# Patient Record
Sex: Male | Born: 1995 | Race: Black or African American | Hispanic: No | Marital: Single | Smoking: Never smoker
Health system: Southern US, Community
[De-identification: ages and names within clinical notes are randomized; demographics above are authoritative.]

## PROBLEM LIST (undated history)

## (undated) DIAGNOSIS — N029 Recurrent and persistent hematuria with unspecified morphologic changes: Secondary | ICD-10-CM

## (undated) DIAGNOSIS — M069 Rheumatoid arthritis, unspecified: Secondary | ICD-10-CM

## (undated) HISTORY — PX: BONE MARROW BIOPSY: SHX199

## (undated) HISTORY — PX: TONSILLECTOMY: SUR1361

## (undated) HISTORY — PX: KNEE SURGERY: SHX244

## (undated) HISTORY — PX: RENAL BIOPSY: SHX156

---
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
## 2015-07-17 NOTE — Discharge Instructions (Signed)
T & A INSTRUCTION SHEET - [facility] °[facility] ° °CREIGHTON VAUGHT, MD °PAUL H. JUENGEL, MD  °P. SCOTT BENNETT °CHAPMAN MCQUEEN, MD ° °[address] [place], [place] [address] TEL. [phone] °[address] [place] [address] [phone] ° °INFORMATION SHEET FOR A TONSILLECTOMY AND ADENDOIDECTOMY ° °About Your Tonsils and Adenoids ° The tonsils and adenoids are normal body tissues that are part of our immune system.  They normally help to protect us against diseases that may enter our mouth and nose.  However, sometimes the tonsils and/or adenoids become too large and obstruct our breathing, especially at night. °  ° If either of these things happen it helps to remove the tonsils and adenoids in order to become healthier. The operation to remove the tonsils and adenoids is called a tonsillectomy and adenoidectomy. ° °The Location of Your Tonsils and Adenoids ° The tonsils are located in the back of the throat on both side and sit in a cradle of muscles. The adenoids are located in the roof of the mouth, behind the nose, and closely associated with the opening of the Eustachian tube to the ear. ° °Surgery on Tonsils and Adenoids ° A tonsillectomy and adenoidectomy is a short operation which takes about thirty minutes.  This includes being put to sleep and being awakened.  Tonsillectomies and adenoidectomies are performed at [facility] and may require observation period in the recovery room prior to going home. ° °Following the Operation for a Tonsillectomy ° A cautery machine is used to control bleeding.  Bleeding from a tonsillectomy and adenoidectomy is minimal and postoperatively the risk of bleeding is approximately four percent, although this rarely life threatening. ° ° ° °After your tonsillectomy and adenoidectomy post-op care at home: ° °1. Our patients are able to go home the same day.  You may be given prescriptions for pain  medications and antibiotics, if indicated. °2. It is extremely important to remember that fluid intake is of utmost importance after a tonsillectomy.  The amount that you drink must be maintained in the postoperative period.  A good indication of whether a child is getting enough fluid is whether his/her urine output is constant.  As long as children are urinating or wetting their diaper every 6 - 8 hours this is usually enough fluid intake.   °3. Although rare, this is a risk of some bleeding in the first ten days after surgery.  This is usually occurs between day five and nine postoperatively.  This risk of bleeding is approximately four percent.  If you or your child should have any bleeding you should remain calm and notify our office or go directly to the Emergency Room at [facility] where they will contact us. Our doctors are available seven days a week for notification.  We recommend sitting up quietly in a chair, place an ice pack on the front of the neck and spitting out the blood gently until we are able to contact you.  Adults should gargle gently with ice water and this may help stop the bleeding.  If the bleeding does not stop after a short time, i.e. 10 to 15 minutes, or seems to be increasing again, please contact us or go to the hospital.   °4. It is common for the pain to be worse at 5 - 7 days postoperatively.  This occurs because the “scab” is peeling off and the mucous membrane (skin of   the throat) is growing back where the tonsils were.   °5. It is common for a low-grade fever, less than 102, during the first week after a tonsillectomy and adenoidectomy.  It is usually due to not drinking enough liquids, and we suggest your use liquid Tylenol or the pain medicine with Tylenol prescribed in order to keep your temperature below 102.  Please follow the directions on the back of the bottle. °6. Do not take aspirin or any products that contain aspirin such as Bufferin, Anacin,  Ecotrin, aspirin gum, Goodies, BC headache powders, etc., after a T&A because it can promote bleeding.  Please check with our office before administering any other medication that may been prescribed by other doctors during the two week post-operative period. °7. If you happen to look in the mirror or into your child’s mouth you will see white/gray patches on the back of the throat.  This is what a scab looks like in the mouth and is normal after having a T&A.  It will disappear once the tonsil area heals completely. However, it may cause a noticeable odor, and this too will disappear with time.     °8. You or your child may experience ear pain after having a T&A.  This is called referred pain and comes from the throat, but it is felt in the ears.  Ear pain is quite common and expected.  It will usually go away after ten days.  There is usually nothing wrong with the ears, and it is primarily due to the healing area stimulating the nerve to the ear that runs along the side of the throat.  Use either the prescribed pain medicine or Tylenol as needed.  °9. The throat tissues after a tonsillectomy are obviously sensitive.  Smoking around children who have had a tonsillectomy significantly increases the risk of bleeding.  DO NOT SMOKE!  ° °General Anesthesia, Adult, Care After °Refer to this sheet in the next few weeks. These instructions provide you with information on caring for yourself after your procedure. Your health care provider may also give you more specific instructions. Your treatment has been planned according to current medical practices, but problems sometimes occur. Call your health care provider if you have any problems or questions after your procedure. °WHAT TO EXPECT AFTER THE PROCEDURE °After the procedure, it is typical to experience: °· Sleepiness. °· Nausea and vomiting. °HOME CARE INSTRUCTIONS °· For the first 24 hours after general anesthesia: °¨ Have a responsible person with you. °¨ Do not  drive a car. If you are alone, do not take public transportation. °¨ Do not drink alcohol. °¨ Do not take medicine that has not been prescribed by your health care provider. °¨ Do not sign important papers or make important decisions. °¨ You may resume a normal diet and activities as directed by your health care provider. °· Change bandages (dressings) as directed. °· If you have questions or problems that seem related to general anesthesia, call the hospital and ask for the anesthetist or anesthesiologist on call. °SEEK MEDICAL CARE IF: °· You have nausea and vomiting that continue the day after anesthesia. °· You develop a rash. °SEEK IMMEDIATE MEDICAL CARE IF:  °· You have difficulty breathing. °· You have chest pain. °· You have any allergic problems. °  °This information is not intended to replace advice given to you by your health care provider. Make sure you discuss any questions you have with your health care provider. °  °Document   Released: 10/20/2000 Document Revised: 08/04/2014 Document Reviewed: 11/12/2011 °Elsevier Interactive Patient Education ©2016 [employer] ° °

## 2015-07-24 ENCOUNTER — Ambulatory Visit: Admission: RE | Admit: 2015-07-24 | Payer: 59 | Source: Ambulatory Visit | Admitting: Otolaryngology

## 2015-07-24 ENCOUNTER — Encounter: Admission: RE | Payer: Self-pay | Source: Ambulatory Visit

## 2015-07-24 SURGERY — TONSILLECTOMY
Anesthesia: General

## 2015-08-01 ENCOUNTER — Encounter: Payer: Self-pay | Admitting: Student in an Organized Health Care Education/Training Program

## 2015-08-01 ENCOUNTER — Encounter: Payer: Self-pay | Admitting: *Deleted

## 2015-08-03 NOTE — Discharge Instructions (Signed)
T & A INSTRUCTION SHEET - [facility] °[facility] ° °CREIGHTON VAUGHT, MD °PAUL H. JUENGEL, MD  °P. SCOTT BENNETT °CHAPMAN MCQUEEN, MD ° °[address] [place], [place] [address] TEL. [phone] °[address] [place] [address] [phone] ° °INFORMATION SHEET FOR A TONSILLECTOMY AND ADENDOIDECTOMY ° °About Your Tonsils and Adenoids ° The tonsils and adenoids are normal body tissues that are part of our immune system.  They normally help to protect us against diseases that may enter our mouth and nose.  However, sometimes the tonsils and/or adenoids become too large and obstruct our breathing, especially at night. °  ° If either of these things happen it helps to remove the tonsils and adenoids in order to become healthier. The operation to remove the tonsils and adenoids is called a tonsillectomy and adenoidectomy. ° °The Location of Your Tonsils and Adenoids ° The tonsils are located in the back of the throat on both side and sit in a cradle of muscles. The adenoids are located in the roof of the mouth, behind the nose, and closely associated with the opening of the Eustachian tube to the ear. ° °Surgery on Tonsils and Adenoids ° A tonsillectomy and adenoidectomy is a short operation which takes about thirty minutes.  This includes being put to sleep and being awakened.  Tonsillectomies and adenoidectomies are performed at [facility] and may require observation period in the recovery room prior to going home. ° °Following the Operation for a Tonsillectomy ° A cautery machine is used to control bleeding.  Bleeding from a tonsillectomy and adenoidectomy is minimal and postoperatively the risk of bleeding is approximately four percent, although this rarely life threatening. ° ° ° °After your tonsillectomy and adenoidectomy post-op care at home: ° °1. Our patients are able to go home the same day.  You may be given prescriptions for pain  medications and antibiotics, if indicated. °2. It is extremely important to remember that fluid intake is of utmost importance after a tonsillectomy.  The amount that you drink must be maintained in the postoperative period.  A good indication of whether a child is getting enough fluid is whether his/her urine output is constant.  As long as children are urinating or wetting their diaper every 6 - 8 hours this is usually enough fluid intake.   °3. Although rare, this is a risk of some bleeding in the first ten days after surgery.  This is usually occurs between day five and nine postoperatively.  This risk of bleeding is approximately four percent.  If you or your child should have any bleeding you should remain calm and notify our office or go directly to the Emergency Room at [facility] where they will contact us. Our doctors are available seven days a week for notification.  We recommend sitting up quietly in a chair, place an ice pack on the front of the neck and spitting out the blood gently until we are able to contact you.  Adults should gargle gently with ice water and this may help stop the bleeding.  If the bleeding does not stop after a short time, i.e. 10 to 15 minutes, or seems to be increasing again, please contact us or go to the hospital.   °4. It is common for the pain to be worse at 5 - 7 days postoperatively.  This occurs because the “scab” is peeling off and the mucous membrane (skin of   the throat) is growing back where the tonsils were.   °5. It is common for a low-grade fever, less than 102, during the first week after a tonsillectomy and adenoidectomy.  It is usually due to not drinking enough liquids, and we suggest your use liquid Tylenol or the pain medicine with Tylenol prescribed in order to keep your temperature below 102.  Please follow the directions on the back of the bottle. °6. Do not take aspirin or any products that contain aspirin such as Bufferin, Anacin,  Ecotrin, aspirin gum, Goodies, BC headache powders, etc., after a T&A because it can promote bleeding.  Please check with our office before administering any other medication that may been prescribed by other doctors during the two week post-operative period. °7. If you happen to look in the mirror or into your child’s mouth you will see white/gray patches on the back of the throat.  This is what a scab looks like in the mouth and is normal after having a T&A.  It will disappear once the tonsil area heals completely. However, it may cause a noticeable odor, and this too will disappear with time.     °8. You or your child may experience ear pain after having a T&A.  This is called referred pain and comes from the throat, but it is felt in the ears.  Ear pain is quite common and expected.  It will usually go away after ten days.  There is usually nothing wrong with the ears, and it is primarily due to the healing area stimulating the nerve to the ear that runs along the side of the throat.  Use either the prescribed pain medicine or Tylenol as needed.  °9. The throat tissues after a tonsillectomy are obviously sensitive.  Smoking around children who have had a tonsillectomy significantly increases the risk of bleeding.  DO NOT SMOKE!  ° °General Anesthesia, Adult, Care After °Refer to this sheet in the next few weeks. These instructions provide you with information on caring for yourself after your procedure. Your health care provider may also give you more specific instructions. Your treatment has been planned according to current medical practices, but problems sometimes occur. Call your health care provider if you have any problems or questions after your procedure. °WHAT TO EXPECT AFTER THE PROCEDURE °After the procedure, it is typical to experience: °· Sleepiness. °· Nausea and vomiting. °HOME CARE INSTRUCTIONS °· For the first 24 hours after general anesthesia: °¨ Have a responsible person with you. °¨ Do not  drive a car. If you are alone, do not take public transportation. °¨ Do not drink alcohol. °¨ Do not take medicine that has not been prescribed by your health care provider. °¨ Do not sign important papers or make important decisions. °¨ You may resume a normal diet and activities as directed by your health care provider. °· Change bandages (dressings) as directed. °· If you have questions or problems that seem related to general anesthesia, call the hospital and ask for the anesthetist or anesthesiologist on call. °SEEK MEDICAL CARE IF: °· You have nausea and vomiting that continue the day after anesthesia. °· You develop a rash. °SEEK IMMEDIATE MEDICAL CARE IF:  °· You have difficulty breathing. °· You have chest pain. °· You have any allergic problems. °  °This information is not intended to replace advice given to you by your health care provider. Make sure you discuss any questions you have with your health care provider. °  °Document   Released: 10/20/2000 Document Revised: 08/04/2014 Document Reviewed: 11/12/2011 °Elsevier Interactive Patient Education ©2016 Elsevier Inc. ° °

## 2015-08-07 ENCOUNTER — Ambulatory Visit: Admission: RE | Admit: 2015-08-07 | Payer: 59 | Source: Ambulatory Visit | Admitting: Otolaryngology

## 2015-08-07 HISTORY — DX: Rheumatoid arthritis, unspecified: M06.9

## 2015-08-07 HISTORY — DX: Recurrent and persistent hematuria with unspecified morphologic changes: N02.9

## 2015-08-07 SURGERY — TONSILLECTOMY
Anesthesia: General

## 2017-03-01 DIAGNOSIS — R070 Pain in throat: Secondary | ICD-10-CM | POA: Diagnosis present

## 2017-03-01 DIAGNOSIS — J029 Acute pharyngitis, unspecified: Secondary | ICD-10-CM | POA: Insufficient documentation

## 2017-03-01 LAB — POCT RAPID STREP A: STREPTOCOCCUS, GROUP A SCREEN (DIRECT): NEGATIVE

## 2017-03-01 NOTE — ED Triage Notes (Signed)
Patient c/o throat/tonsil pain. Pt has swelling to bilateral tonsils with bloody drainage from left tonsil.

## 2017-03-02 ENCOUNTER — Emergency Department
Admission: EM | Admit: 2017-03-02 | Discharge: 2017-03-02 | Disposition: A | Discharge status: Home / Self Care | Payer: 59 | Attending: Emergency Medicine | Admitting: Emergency Medicine

## 2017-03-02 DIAGNOSIS — J039 Acute tonsillitis, unspecified: Secondary | ICD-10-CM

## 2017-03-02 DIAGNOSIS — J029 Acute pharyngitis, unspecified: Secondary | ICD-10-CM

## 2017-03-02 MED ORDER — AMOXICILLIN 500 MG PO CAPS
1000.0000 mg | ORAL_CAPSULE | Freq: Once | ORAL | Status: AC
  Administered 2017-03-02: 1000 mg via ORAL
  Filled 2017-03-02: qty 2

## 2017-03-02 MED ORDER — AMOXICILLIN 875 MG PO TABS: 875.0000 mg | ORAL_TABLET | Freq: Two times a day (BID) | ORAL | 0 refills | Status: DC

## 2017-03-02 NOTE — ED Provider Notes (Signed)
[facility] Emergency Department Provider Note   ____________________________________________   First MD Initiated Contact with Patient 03/02/17 [phone]     (approximate)  I have reviewed the triage vital signs and the nursing notes.   HISTORY  Chief Complaint Sore Throat    HPI Terry Romero is a 21 y.o. male who comes into the hospital today with some throat pain. He reports that his tonsils are bleeding earlier but he is not sure which one. He reports that when he looks at them there are gross. The blood has clotted but now it looks that there is pus on them. The patient has not been taking anything for his pain. He reports that this pain is a 3 out of 10 in intensity. He reports that the pain started yesterday and then started getting worse. He's had no fevers no nausea or vomiting. The patient has had problems with his tonsils for over a year and was supposed to have a tonsillectomy. He reports that he's missed his appointment and he is unable to be seen at the [facility] and [place] anymore. He reports that he has not followed up with anyone since. The patient is here today for evaluation.He reports that sometimes when he sleeping he does feel short of breath but he doesn't have any other symptoms at this moment.   Past Medical History:  Diagnosis Date  . Rheumatoid arthritis (HCC)    no meds, no limitations  . Thin basement membrane disease     There are no active problems to display for this patient.   Past Surgical History:  Procedure Laterality Date  . BONE MARROW BIOPSY    . KNEE SURGERY Right   . RENAL BIOPSY      Prior to Admission medications   Medication Sig Start Date End Date Taking? Authorizing Provider  amoxicillin (AMOXIL) 875 MG tablet Take 1 tablet (875 mg total) by mouth 2 (two) times daily. 03/02/17   Loney Hering, MD    Allergies Nsaids  No family history on file.  Social History Social History   Substance Use Topics  . Smoking status: Never Smoker  . Smokeless tobacco: Never Used  . Alcohol use No    Review of Systems  Constitutional: No fever/chills Eyes: No visual changes. ENT:  sore throat, pus on tonsils Cardiovascular: Denies chest pain. Respiratory: Denies shortness of breath. Gastrointestinal: No abdominal pain.  No nausea, no vomiting.  No diarrhea.  No constipation. Genitourinary: Negative for dysuria. Musculoskeletal: Negative for back pain. Skin: Negative for rash. Neurological: Negative for headaches, focal weakness or numbness.   ____________________________________________   PHYSICAL EXAM:  VITAL SIGNS: ED Triage Vitals  Enc Vitals Group     BP 03/01/17 2301 (!) 149/98     Pulse Rate 03/01/17 2301 77     Resp 03/01/17 2301 15     Temp 03/01/17 2301 98.7 F (37.1 C)     Temp Source 03/01/17 2301 Oral     SpO2 03/01/17 2301 100 %     Weight 03/01/17 2302 143 lb (64.9 kg)     Height 03/01/17 2302 6' (1.829 m)     Head Circumference --      Peak Flow --      Pain Score 03/01/17 2301 3     Pain Loc --      Pain Edu? --      Excl. in [place]? --     Constitutional: Alert and oriented. Well  appearing and in Mild distress. Eyes: Conjunctivae are normal. PERRL. EOMI. Head: Atraumatic. Nose: No congestion/rhinnorhea. Mouth/Throat: Mucous membranes are moist.  Oropharynx non-erythematous. Patient with some large tonsils that shows some exudate bilaterally. No active bleeding at this time. No peri tonsillar abscess or significant erythema. Hematological/Lymphatic/Immunilogical: cervical lymphadenopathy. Cardiovascular: Normal rate, regular rhythm. Grossly normal heart sounds.  Good peripheral circulation. Respiratory: Normal respiratory effort.  No retractions. Lungs CTAB. Gastrointestinal: Soft and nontender. No distention. Positive bowel sounds Musculoskeletal: No lower extremity tenderness nor edema.   Neurologic:  Normal speech and language.  Skin:   Skin is warm, dry and intact. [facility] Psychiatric: Mood and affect are normal.   ____________________________________________   LABS (all labs ordered are listed, but only abnormal results are displayed)  Labs Reviewed  POCT RAPID STREP A   ____________________________________________  EKG  none ____________________________________________  RADIOLOGY  No results found.  ____________________________________________   PROCEDURES  Procedure(s) performed: None  Procedures  Critical Care performed: No  ____________________________________________   INITIAL IMPRESSION / ASSESSMENT AND PLAN / ED COURSE  Pertinent labs & imaging results that were available during my care of the patient were reviewed by me and considered in my medical decision making (see chart for details).  This is a 21 year old male who comes into the hospital today with some sore throat and tonsillar bleeding. The patient also noticed some pus on his tonsils. The patient doesn't have any bleeding but he does have some tonsillar exudates. The patient's strep was negative but I will still treat the patient with some amoxicillin. I encouraged him to follow-up with his tonsils are very large and they need to be removed. The patient reports that he will follow-up. The patient will be discharged home to follow-up with ENT.      ____________________________________________   FINAL CLINICAL IMPRESSION(S) / ED DIAGNOSES  Final diagnoses:  Pharyngitis, unspecified etiology  Tonsillitis      NEW MEDICATIONS STARTED DURING THIS VISIT:  Discharge Medication List as of 03/02/2017  3:03 AM    START taking these medications   Details  amoxicillin (AMOXIL) 875 MG tablet Take 1 tablet (875 mg total) by mouth 2 (two) times daily., Starting Mon 03/02/2017, Print         Note:  This document was prepared using Dragon voice recognition software and may include unintentional dictation errors.    Loney Hering,  MD 03/02/17 [phone]

## 2017-03-02 NOTE — Discharge Instructions (Signed)
Please follow up with ENT

## 2018-12-15 ENCOUNTER — Emergency Department
Admission: EM | Admit: 2018-12-15 | Discharge: 2018-12-15 | Disposition: A | Discharge status: Home / Self Care | Payer: 59 | Attending: Emergency Medicine | Admitting: Emergency Medicine

## 2018-12-15 ENCOUNTER — Other Ambulatory Visit: Payer: Self-pay

## 2018-12-15 ENCOUNTER — Encounter: Payer: Self-pay | Admitting: Emergency Medicine

## 2018-12-15 ENCOUNTER — Emergency Department: Payer: 59

## 2018-12-15 DIAGNOSIS — R1032 Left lower quadrant pain: Secondary | ICD-10-CM | POA: Diagnosis present

## 2018-12-15 DIAGNOSIS — K5792 Diverticulitis of intestine, part unspecified, without perforation or abscess without bleeding: Secondary | ICD-10-CM

## 2018-12-15 DIAGNOSIS — K5732 Diverticulitis of large intestine without perforation or abscess without bleeding: Secondary | ICD-10-CM | POA: Diagnosis not present

## 2018-12-15 LAB — COMPREHENSIVE METABOLIC PANEL
ALT: 17 U/L (ref 0–44)
AST: 17 U/L (ref 15–41)
Albumin: 4.1 g/dL (ref 3.5–5.0)
Alkaline Phosphatase: 56 U/L (ref 38–126)
Anion gap: 8 (ref 5–15)
BUN: 13 mg/dL (ref 6–20)
CO2: 25 mmol/L (ref 22–32)
Calcium: 9.1 mg/dL (ref 8.9–10.3)
Chloride: 99 mmol/L (ref 98–111)
Creatinine, Ser: 0.91 mg/dL (ref 0.61–1.24)
GFR calc Af Amer: >60 mL/min (ref >60)
GFR calc non Af Amer: >60 mL/min (ref >60)
Glucose, Bld: 153 mg/dL — ABNORMAL HIGH (ref 70–99)
Potassium: 3.8 mmol/L (ref 3.5–5.1)
Sodium: 132 mmol/L — ABNORMAL LOW (ref 135–145)
Total Bilirubin: 1.5 mg/dL — ABNORMAL HIGH (ref 0.3–1.2)
Total Protein: 7.7 g/dL (ref 6.5–8.1)

## 2018-12-15 LAB — CBC
HCT: 38.3 % — ABNORMAL LOW (ref 39.0–52.0)
Hemoglobin: 13.4 g/dL (ref 13.0–17.0)
MCH: 27 pg (ref 26.0–34.0)
MCHC: 35 g/dL (ref 30.0–36.0)
MCV: 77.1 fL — ABNORMAL LOW (ref 80.0–100.0)
Platelets: 199 10*3/uL (ref 150–400)
RBC: 4.97 MIL/uL (ref 4.22–5.81)
RDW: 13.3 % (ref 11.5–15.5)
WBC: 14.8 10*3/uL — ABNORMAL HIGH (ref 4.0–10.5)
nRBC: 0 % (ref 0.0–0.2)

## 2018-12-15 LAB — URINALYSIS, COMPLETE (UACMP) WITH MICROSCOPIC
Bacteria, UA: NONE SEEN
Bilirubin Urine: NEGATIVE
Glucose, UA: NEGATIVE mg/dL
Hgb urine dipstick: NEGATIVE
Ketones, ur: 5 mg/dL — AB
Leukocytes,Ua: NEGATIVE
Nitrite: NEGATIVE
Protein, ur: NEGATIVE mg/dL
Specific Gravity, Urine: 1.029 (ref 1.005–1.030)
Squamous Epithelial / HPF: NONE SEEN (ref 0–5)
pH: 6 (ref 5.0–8.0)

## 2018-12-15 LAB — LIPASE, BLOOD: Lipase: 26 U/L (ref 11–51)

## 2018-12-15 MED ORDER — OXYCODONE-ACETAMINOPHEN 5-325 MG PO TABS: 1.0000 | ORAL_TABLET | Freq: Three times a day (TID) | ORAL | 0 refills | Status: DC | PRN

## 2018-12-15 MED ORDER — IOHEXOL 300 MG/ML  SOLN
100.0000 mL | Freq: Once | INTRAMUSCULAR | Status: AC | PRN
  Administered 2018-12-15: 100 mL via INTRAVENOUS

## 2018-12-15 MED ORDER — SODIUM CHLORIDE 0.9% FLUSH: 3.0000 mL | Freq: Once | INTRAVENOUS | Status: DC

## 2018-12-15 MED ORDER — AMOXICILLIN-POT CLAVULANATE ER 1000-62.5 MG PO TB12: 1.0000 | ORAL_TABLET | Freq: Two times a day (BID) | ORAL | 0 refills | Status: AC

## 2018-12-15 NOTE — ED Triage Notes (Signed)
Pt here with c/o abd pain LLQ that began 2 days ago, denies N,V,D. NAD.

## 2018-12-15 NOTE — ED Provider Notes (Signed)
[name]       Time seen: ----------------------------------------- 11:11 AM on 12/15/2018 -----------------------------------------   I have reviewed the triage vital signs and the nursing notes.  HISTORY   Chief Complaint Abdominal Pain    HPI Story Terry Romero is a 23 y.o. male with a history of rheumatoid arthritis who presents to the ED for left lower quadrant pain that began 2 days ago.  Patient denies nausea, vomiting or diarrhea.  He also denies fevers or chills.  Pain is 9 out of 10 in the left lower abdomen.  Nothing makes it better or worse.  Past Medical History:  Diagnosis Date  . Rheumatoid arthritis (HCC)    no meds, no limitations  . Thin basement membrane disease     There are no active problems to display for this patient.   Past Surgical History:  Procedure Laterality Date  . BONE MARROW BIOPSY    . KNEE SURGERY Right   . RENAL BIOPSY      Allergies Meperidine and related and Nsaids  Social History Social History   Tobacco Use  . Smoking status: Never Smoker  . Smokeless tobacco: Never Used  Substance Use Topics  . Alcohol use: No  . Drug use: Not on file   Review of Systems Constitutional: Negative for fever. Cardiovascular: Negative for chest pain. Respiratory: Negative for shortness of breath. Gastrointestinal: Positive for abdominal pain Musculoskeletal: Negative for back pain. Skin: Negative for rash. Neurological: Negative for headaches, focal weakness or numbness.  All systems negative/normal/unremarkable except as stated in the HPI  ____________________________________________   PHYSICAL EXAM:  VITAL SIGNS: ED Triage Vitals  Enc Vitals Group     BP 12/15/18 0915 140/79     Pulse Rate 12/15/18 0915 (!) 101     Resp 12/15/18 0915 16     Temp 12/15/18 0915 98.6 F (37 C)     Temp Source 12/15/18 0915 Oral     SpO2 12/15/18 0915 100 %     Weight 12/15/18 0914 150  lb (68 kg)     Height 12/15/18 0914 6' (1.829 m)     Head Circumference --      Peak Flow --      Pain Score 12/15/18 0914 9     Pain Loc --      Pain Edu? --      Excl. in [place]? --    Constitutional: Alert and oriented. Well appearing and in no distress. Eyes: Conjunctivae are normal. Normal extraocular movements. ENT      Head: Normocephalic and atraumatic.      Nose: No congestion/rhinnorhea.      Mouth/Throat: Mucous membranes are moist.      Neck: No stridor. Cardiovascular: Normal rate, regular rhythm. No murmurs, rubs, or gallops. Respiratory: Normal respiratory effort without tachypnea nor retractions. Breath sounds are clear and equal bilaterally. No wheezes/rales/rhonchi. Gastrointestinal: Mild left lower quadrant tenderness, no rebound or guarding.  Normal bowel sounds. Musculoskeletal: Nontender with normal range of motion in extremities. No lower extremity tenderness nor edema. Neurologic:  Normal speech and language. No gross focal neurologic deficits are appreciated.  Skin:  Skin is warm, dry and intact. No rash noted. Psychiatric: Mood and affect are normal. Speech and behavior are normal.  ___________________________________________  ED COURSE:  As part of my medical decision making, I reviewed the following data within the [facility] History obtained from family if available, nursing notes, old chart and ekg, as  well as notes from prior ED visits. Patient presented for abdominal pain, we will assess with labs and imaging as indicated at this time.   Procedures  Hever Castilleja was evaluated in Emergency Department on 12/15/2018 for the symptoms described in the history of present illness. He was evaluated in the context of the global COVID-19 pandemic, which necessitated consideration that the patient might be at risk for infection with the SARS-CoV-2 virus that causes COVID-19. Institutional protocols and algorithms that pertain to the evaluation of  patients at risk for COVID-19 are in a state of rapid change based on information released by regulatory bodies including the CDC and federal and state organizations. These policies and algorithms were followed during the patient's care in the ED.  ____________________________________________   LABS (pertinent positives/negatives)  Labs Reviewed  COMPREHENSIVE METABOLIC PANEL - Abnormal; Notable for the following components:      Result Value   Sodium 132 (*)    Glucose, Bld 153 (*)    Total Bilirubin 1.5 (*)    All other components within normal limits  CBC - Abnormal; Notable for the following components:   WBC 14.8 (*)    HCT 38.3 (*)    MCV 77.1 (*)    All other components within normal limits  URINALYSIS, COMPLETE (UACMP) WITH MICROSCOPIC - Abnormal; Notable for the following components:   Color, Urine YELLOW (*)    APPearance CLEAR (*)    Ketones, ur 5 (*)    All other components within normal limits  LIPASE, BLOOD    RADIOLOGY Images were viewed by me  IMPRESSION: 1. Distal descending colonic diverticulitis. There is wall thickening in this area with mesenteric thickening and fluid tracking into the lateral conal fascia. No perforation or abscess in this area. Occasional sigmoid diverticula noted without inflammation in these areas.  2. No evident bowel obstruction. No abscess in the abdomen or pelvis. No periappendiceal region inflammation. Small amount of fluid in the dependent portion of the pelvis, likely of reactive etiology given the distal descending colonic diverticulitis.  3. No evident renal or ureteral calculus. No hydronephrosis on either side. Urinary bladder wall thickness within normal limits.  ____________________________________________   DIFFERENTIAL DIAGNOSIS   Muscle strain, renal colic, UTI, pyelonephritis, diverticulitis unlikely  FINAL ASSESSMENT AND PLAN  Diverticulitis   Plan: The patient had presented for left lower quadrant  pain. Patient's labs feel leukocytosis with no other acute abnormality. Patient's imaging surprisingly revealed diverticulitis.  He will be placed on antibiotics with pain medicine and will be referred to GI for close outpatient follow-up.   Laurence Aly, MD    Note: This note was generated in part or whole with voice recognition software. Voice recognition is usually quite accurate but there are transcription errors that can and very often do occur. I apologize for any typographical errors that were not detected and corrected.     Earleen Newport, MD 12/15/18 [phone]

## 2018-12-15 NOTE — ED Notes (Signed)
Patient transported to CT 

## 2020-12-28 ENCOUNTER — Other Ambulatory Visit: Payer: Self-pay

## 2020-12-28 ENCOUNTER — Emergency Department
Admission: EM | Admit: 2020-12-28 | Discharge: 2020-12-28 | Disposition: A | Discharge status: Home / Self Care | Payer: 59 | Attending: Emergency Medicine | Admitting: Emergency Medicine

## 2020-12-28 ENCOUNTER — Emergency Department: Payer: 59

## 2020-12-28 DIAGNOSIS — K5732 Diverticulitis of large intestine without perforation or abscess without bleeding: Secondary | ICD-10-CM | POA: Diagnosis not present

## 2020-12-28 DIAGNOSIS — K5792 Diverticulitis of intestine, part unspecified, without perforation or abscess without bleeding: Secondary | ICD-10-CM

## 2020-12-28 DIAGNOSIS — R1032 Left lower quadrant pain: Secondary | ICD-10-CM | POA: Diagnosis present

## 2020-12-28 LAB — COMPREHENSIVE METABOLIC PANEL
ALT: 17 U/L (ref 0–44)
AST: 19 U/L (ref 15–41)
Albumin: 4.2 g/dL (ref 3.5–5.0)
Alkaline Phosphatase: 50 U/L (ref 38–126)
Anion gap: 8 (ref 5–15)
BUN: 12 mg/dL (ref 6–20)
CO2: 25 mmol/L (ref 22–32)
Calcium: 8.9 mg/dL (ref 8.9–10.3)
Chloride: 104 mmol/L (ref 98–111)
Creatinine, Ser: 0.92 mg/dL (ref 0.61–1.24)
GFR, Estimated: >60 mL/min (ref >60)
Glucose, Bld: 111 mg/dL — ABNORMAL HIGH (ref 70–99)
Potassium: 4.2 mmol/L (ref 3.5–5.1)
Sodium: 137 mmol/L (ref 135–145)
Total Bilirubin: 0.7 mg/dL (ref 0.3–1.2)
Total Protein: 7.3 g/dL (ref 6.5–8.1)

## 2020-12-28 LAB — URINALYSIS, COMPLETE (UACMP) WITH MICROSCOPIC
Bacteria, UA: NONE SEEN
Bilirubin Urine: NEGATIVE
Glucose, UA: NEGATIVE mg/dL
Hgb urine dipstick: NEGATIVE
Ketones, ur: NEGATIVE mg/dL
Leukocytes,Ua: NEGATIVE
Nitrite: NEGATIVE
Protein, ur: NEGATIVE mg/dL
Specific Gravity, Urine: 1.029 (ref 1.005–1.030)
Squamous Epithelial / HPF: NONE SEEN (ref 0–5)
pH: 6 (ref 5.0–8.0)

## 2020-12-28 LAB — CBC
HCT: 38.6 % — ABNORMAL LOW (ref 39.0–52.0)
Hemoglobin: 13.5 g/dL (ref 13.0–17.0)
MCH: 27.4 pg (ref 26.0–34.0)
MCHC: 35 g/dL (ref 30.0–36.0)
MCV: 78.3 fL — ABNORMAL LOW (ref 80.0–100.0)
Platelets: 212 10*3/uL (ref 150–400)
RBC: 4.93 MIL/uL (ref 4.22–5.81)
RDW: 13.5 % (ref 11.5–15.5)
WBC: 13.4 10*3/uL — ABNORMAL HIGH (ref 4.0–10.5)
nRBC: 0 % (ref 0.0–0.2)

## 2020-12-28 LAB — LIPASE, BLOOD: Lipase: 29 U/L (ref 11–51)

## 2020-12-28 MED ORDER — MORPHINE SULFATE (PF) 4 MG/ML IV SOLN
4.0000 mg | Freq: Once | INTRAVENOUS | Status: AC
  Administered 2020-12-28: 4 mg via INTRAVENOUS
  Filled 2020-12-28: qty 1

## 2020-12-28 MED ORDER — AMOXICILLIN-POT CLAVULANATE 875-125 MG PO TABS: 1.0000 | ORAL_TABLET | Freq: Three times a day (TID) | ORAL | 0 refills | Status: AC

## 2020-12-28 MED ORDER — ONDANSETRON HCL 4 MG/2ML IJ SOLN
4.0000 mg | Freq: Once | INTRAMUSCULAR | Status: AC
  Administered 2020-12-28: 4 mg via INTRAVENOUS
  Filled 2020-12-28: qty 2

## 2020-12-28 MED ORDER — SODIUM CHLORIDE 0.9 % IV BOLUS
1000.0000 mL | Freq: Once | INTRAVENOUS | Status: AC
  Administered 2020-12-28: 1000 mL via INTRAVENOUS

## 2020-12-28 MED ORDER — OXYCODONE-ACETAMINOPHEN 5-325 MG PO TABS: 1.0000 | ORAL_TABLET | ORAL | 0 refills | Status: AC | PRN

## 2020-12-28 NOTE — ED Triage Notes (Signed)
First Nurse Note:  Sent from [facility] for ED evaluation of LLQ abdominal pain.  Patient states he has history of Diverticulosis.  AAOx3.  Skin warm and dry. NAD

## 2020-12-28 NOTE — ED Provider Notes (Signed)
[facility] Emergency Department Provider Note  ____________________________________________   Event Date/Time   First MD Initiated Contact with Patient 12/28/20 1554     (approximate)  I have reviewed the triage vital signs and the nursing notes.   HISTORY  Chief Complaint Abdominal Pain    HPI Terry Romero is a 25 y.o. male history of rheumatoid arthritis and diverticulitis presents emergency department with left lower quadrant abdominal pain.  [facility] and they told him he needed to come here for imaging.  States he did not realize how painful the left lower quadrant was until they pushed on it.  He denies any fever or chills.  No chest pain shortness of breath.  No diarrhea.  No dysuria.    Past Medical History:  Diagnosis Date  . Rheumatoid arthritis (HCC)    no meds, no limitations  . Thin basement membrane disease     There are no problems to display for this patient.   Past Surgical History:  Procedure Laterality Date  . BONE MARROW BIOPSY    . KNEE SURGERY Right   . RENAL BIOPSY      Prior to Admission medications   Medication Sig Start Date End Date Taking? Authorizing Provider  amoxicillin-clavulanate (AUGMENTIN) 875-125 MG tablet Take 1 tablet by mouth 3 (three) times daily for 7 days. 12/28/20 01/04/21 Yes Dayzha Pogosyan, Roselyn Bering, PA-C  oxyCODONE-acetaminophen (PERCOCET) 5-325 MG tablet Take 1 tablet by mouth every 4 (four) hours as needed for severe pain. 12/28/20 12/28/21 Yes Viral Schramm, Roselyn Bering, PA-C    Allergies Meperidine and related and Nsaids  No family history on file.  Social History Social History   Tobacco Use  . Smoking status: Never Smoker  . Smokeless tobacco: Never Used  Substance Use Topics  . Alcohol use: No    Review of Systems  Constitutional: No fever/chills Eyes: No visual changes. ENT: No sore throat. Respiratory: Denies cough Cardiovascular: Denies chest  pain Gastrointestinal: Positive for llq abdominal pain Genitourinary: Negative for dysuria. Musculoskeletal: Negative for back pain. Skin: Negative for rash. Psychiatric: no mood changes,     ____________________________________________   PHYSICAL EXAM:  VITAL SIGNS: ED Triage Vitals [12/28/20 1419]  Enc Vitals Group     BP 130/77     Pulse Rate 85     Resp 18     Temp 98.9 F (37.2 C)     Temp Source Oral     SpO2 99 %     Weight 164 lb (74.4 kg)     Height 6' (1.829 m)     Head Circumference      Peak Flow      Pain Score      Pain Loc      Pain Edu?      Excl. in [place]?     Constitutional: Alert and oriented. Well appearing and in no acute distress. Eyes: Conjunctivae are normal.  Head: Atraumatic. Nose: No congestion/rhinnorhea. Mouth/Throat: Mucous membranes are moist.   Neck:  supple no lymphadenopathy noted Cardiovascular: Normal rate, regular rhythm. Heart sounds are normal Respiratory: Normal respiratory effort.  No retractions, lungs c t a  Abd: soft tender in the left lower quadrant, bs normal all 4 quad GU: deferred Musculoskeletal: FROM all extremities, warm and well perfused Neurologic:  Normal speech and language.  Skin:  Skin is warm, dry and intact. No rash noted. Psychiatric: Mood and affect are normal. Speech and behavior are normal.  ____________________________________________   LABS (all labs ordered are listed, but only abnormal results are displayed)  Labs Reviewed  COMPREHENSIVE METABOLIC PANEL - Abnormal; Notable for the following components:      Result Value   Glucose, Bld 111 (*)    All other components within normal limits  CBC - Abnormal; Notable for the following components:   WBC 13.4 (*)    HCT 38.6 (*)    MCV 78.3 (*)    All other components within normal limits  URINALYSIS, COMPLETE (UACMP) WITH MICROSCOPIC - Abnormal; Notable for the following components:   Color, Urine YELLOW (*)    APPearance CLEAR (*)    All other  components within normal limits  LIPASE, BLOOD   ____________________________________________   ____________________________________________  RADIOLOGY  CT abdomen/pelvis without contrast secondary to contrast shortage  ____________________________________________   PROCEDURES  Procedure(s) performed: No  Procedures    ____________________________________________   INITIAL IMPRESSION / ASSESSMENT AND PLAN / ED COURSE  Pertinent labs & imaging results that were available during my care of the patient were reviewed by me and considered in my medical decision making (see chart for details).   Patient is a 25 year old male presents with left lower quadrant pain.  See HPI.  Physical exam shows patient appears stable  DDx: Kidney stone, diverticulosis, diverticulitis, abscess  CBC is elevated at 13.4, comprehensive metabolic panel is normal, lipase normal, urinalysis is normal  CT abdomen/pelvis without contrast secondary to contrast shortage   CT was reviewed by me confirmed by radiology.  Shows acute diverticulitis.  I did explain findings to the patient.  He is placed on Augmentin 3 times daily.  Percocet for pain.  Given a work note.  Instructed to return if worsening.  Follow-up with his GI doctor at [facility].  He was discharged stable condition.  Terry Romero was evaluated in Emergency Department on 12/28/2020 for the symptoms described in the history of present illness. He was evaluated in the context of the global COVID-19 pandemic, which necessitated consideration that the patient might be at risk for infection with the SARS-CoV-2 virus that causes COVID-19. Institutional protocols and algorithms that pertain to the evaluation of patients at risk for COVID-19 are in a state of rapid change based on information released by regulatory bodies including the CDC and federal and state organizations. These policies and algorithms were followed during the patient's care in the ED.     As part of my medical decision making, I reviewed the following data within the [place] notes reviewed and incorporated, Labs reviewed , Old chart reviewed, Radiograph reviewed , Notes from prior ED visits and [place] Controlled Substance Database  ____________________________________________   FINAL CLINICAL IMPRESSION(S) / ED DIAGNOSES  Final diagnoses:  Acute diverticulitis      NEW MEDICATIONS STARTED DURING THIS VISIT:  New Prescriptions   AMOXICILLIN-CLAVULANATE (AUGMENTIN) 875-125 MG TABLET    Take 1 tablet by mouth 3 (three) times daily for 7 days.   OXYCODONE-ACETAMINOPHEN (PERCOCET) 5-325 MG TABLET    Take 1 tablet by mouth every 4 (four) hours as needed for severe pain.     Note:  This document was prepared using Dragon voice recognition software and may include unintentional dictation errors.    Versie Starks, PA-C 12/28/20 1644    Nance Pear, MD 12/28/20 [phone]

## 2020-12-28 NOTE — ED Triage Notes (Signed)
Pt here from UC with LLQ abd pain. Pt has hx of diverticulosis. Pt is here for imaging.

## 2020-12-28 NOTE — Discharge Instructions (Signed)
Follow-up with your GI doctor at [facility] if not improving in 2 to 3 days.  Return emergency department worsening.  Take medications as prescribed.

## 2021-11-04 ENCOUNTER — Emergency Department: Payer: Managed Care, Other (non HMO)

## 2021-11-04 ENCOUNTER — Emergency Department
Admission: EM | Admit: 2021-11-04 | Discharge: 2021-11-04 | Disposition: A | Discharge status: Home / Self Care | Payer: Managed Care, Other (non HMO) | Attending: Emergency Medicine | Admitting: Emergency Medicine

## 2021-11-04 ENCOUNTER — Other Ambulatory Visit: Payer: Self-pay

## 2021-11-04 DIAGNOSIS — R079 Chest pain, unspecified: Secondary | ICD-10-CM | POA: Insufficient documentation

## 2021-11-04 LAB — BASIC METABOLIC PANEL
Anion gap: 9 (ref 5–15)
BUN: 10 mg/dL (ref 6–20)
CO2: 29 mmol/L (ref 22–32)
Calcium: 9.3 mg/dL (ref 8.9–10.3)
Chloride: 101 mmol/L (ref 98–111)
Creatinine, Ser: 0.99 mg/dL (ref 0.61–1.24)
GFR, Estimated: >60 mL/min (ref >60)
Glucose, Bld: 86 mg/dL (ref 70–99)
Potassium: 3.3 mmol/L — ABNORMAL LOW (ref 3.5–5.1)
Sodium: 139 mmol/L (ref 135–145)

## 2021-11-04 LAB — CBC
HCT: 42.8 % (ref 39.0–52.0)
Hemoglobin: 14.5 g/dL (ref 13.0–17.0)
MCH: 26.3 pg (ref 26.0–34.0)
MCHC: 33.9 g/dL (ref 30.0–36.0)
MCV: 77.5 fL — ABNORMAL LOW (ref 80.0–100.0)
Platelets: 230 10*3/uL (ref 150–400)
RBC: 5.52 MIL/uL (ref 4.22–5.81)
RDW: 12.9 % (ref 11.5–15.5)
WBC: 7.3 10*3/uL (ref 4.0–10.5)
nRBC: 0 % (ref 0.0–0.2)

## 2021-11-04 LAB — TROPONIN I (HIGH SENSITIVITY): Troponin I (High Sensitivity): 2 ng/L (ref <18)

## 2021-11-04 NOTE — Discharge Instructions (Signed)
Your blood work EKG and chest x-ray were all reassuring.  It is unlikely that this is related to your heart however if your pain is worsening or you develop new symptoms that are concerning to you such as shortness of breath, please return to the emergency department.  Otherwise please follow-up with primary care. ?

## 2021-11-04 NOTE — ED Provider Notes (Signed)
? ?[facility] ?Provider Note ? ? ? Event Date/Time  ? First MD Initiated Contact with Patient 11/04/21 2057   ?  (approximate) ? ? ?History  ? ?Chest Pain ? ? ?HPI ? ?Terry Romero is a 26 y.o. male thin membrane basement disease, RA, who presents with CP.  Symptoms have been going on for the last 3 days.  He endorses pressure- like sensation left side of his chest that is intermittent.  It is nonpleuritic nonexertional.  Does feel somewhat worse when he is moving around.  It comes and goes.  Not associated with shortness of breath nausea vomiting or diaphoresis.  Has no personal or family history of cardiac disease.The patient denies hx of prior DVT/PE, unilateral leg pain/swelling, hormone use, recent surgery, hx of cancer, prolonged immobilization, or hemoptysis.   ? ?  ? ?Past Medical History:  ?Diagnosis Date  ? Rheumatoid arthritis (HCC)   ? no meds, no limitations  ? Thin basement membrane disease   ? ? ?There are no problems to display for this patient. ? ? ? ?Physical Exam  ?Triage Vital Signs: ?ED Triage Vitals  ?Enc Vitals Group  ?   BP 11/04/21 1928 (!) 137/91  ?   Pulse Rate 11/04/21 1928 90  ?   Resp 11/04/21 1928 16  ?   Temp 11/04/21 1928 98.7 ?F (37.1 ?C)  ?   Temp Source 11/04/21 1928 Oral  ?   SpO2 11/04/21 1928 100 %  ?   Weight 11/04/21 1925 160 lb (72.6 kg)  ?   Height 11/04/21 1925 6' (1.829 m)  ?   Head Circumference --   ?   Peak Flow --   ?   Pain Score 11/04/21 1924 0  ?   Pain Loc --   ?   Pain Edu? --   ?   Excl. in [place]? --   ? ? ?Most recent vital signs: ?Vitals:  ? 11/04/21 1928 11/04/21 2152  ?BP: (!) 137/91 131/81  ?Pulse: 90 87  ?Resp: 16 18  ?Temp: 98.7 ?F (37.1 ?C)   ?SpO2: 100% 100%  ? ? ? ?General: Awake, no distress.  ?CV:  Good peripheral perfusion. No edema ?Resp:  Normal effort. Clear lungs ?Abd:  No distention.  ?Neuro:             Awake, Alert, Oriented x 3  ?Other:   ? ? ?ED Results / Procedures / Treatments  ?Labs ?(all labs ordered are listed, but  only abnormal results are displayed) ?Labs Reviewed  ?BASIC METABOLIC PANEL - Abnormal; Notable for the following components:  ?    Result Value  ? Potassium 3.3 (*)   ? All other components within normal limits  ?CBC - Abnormal; Notable for the following components:  ? MCV 77.5 (*)   ? All other components within normal limits  ?TROPONIN I (HIGH SENSITIVITY)  ?TROPONIN I (HIGH SENSITIVITY)  ? ? ? ?EKG ? ?EKG interpretation performed by myself: NSR, right axis, nml intervals, slight sagging of ST segments in inferior leads, no acute ischemic changes ? ? ? ?RADIOLOGY ?I reviewed the CXR which does not show any acute cardiopulmonary process; agree with radiology report  ? ? ? ?PROCEDURES: ? ?Critical Care performed: No ? ?Procedures ? ? ?MEDICATIONS ORDERED IN ED: ?Medications - No data to display ? ? ?IMPRESSION / MDM / ASSESSMENT AND PLAN / ED COURSE  ?I reviewed the triage vital signs and the nursing notes. ?             ?               ? ?  Differential diagnosis includes, but is not limited to, pleurisy, musculoskeletal, GERD, pericarditis, less likely myocarditis ACS pulmonary embolism ? ?Patient is a 26 year old male who presents with chest pain.  His left-sided nonexertional without associated symptoms.  Coming and going.  He has a history of rheumatoid arthritis and thin basement membrane kidney disease but no risk factors for early coronary disease.  Vital signs within normal limits he is not tachycardic or hypoxic.  Appears quite comfortable on exam EKG does have a right axis some nonspecific sagging of the ST segments in the inferior leads but overall it is a nonischemic EKG.  Patient's troponin is negative at 2.  Symptoms have been going on for several days do not feel need to repeat.  Ultimately suspicion for cardiac ischemia is low given his age and lack of risk factors negative troponin.  Considered pulmonary embolism but he is PERC negative do not believe he requires further work-up.  Chest x-ray is  negative.  Discharge with supportive care.  Discussed return for worsening or new symptoms. ? ?  ? ? ?FINAL CLINICAL IMPRESSION(S) / ED DIAGNOSES  ? ?Final diagnoses:  ?Chest pain, unspecified type  ? ? ? ?Rx / DC Orders  ? ?ED Discharge Orders   ? ? None  ? ?  ? ? ? ?Note:  This document was prepared using Dragon voice recognition software and may include unintentional dictation errors. ?  ?Georga Hacking, MD ?11/04/21 2338 ? ?

## 2021-11-04 NOTE — ED Triage Notes (Signed)
Pt presents via POV c/o chest pain x3 days. Reports pressure type pain that has been constant for the past 3 days. Pt sent by [facility] per pt report. Denies SOB ?

## 2022-04-28 ENCOUNTER — Ambulatory Visit
Admission: EM | Admit: 2022-04-28 | Discharge: 2022-04-28 | Disposition: A | Discharge status: Home / Self Care | Payer: Managed Care, Other (non HMO)

## 2022-04-28 DIAGNOSIS — H6123 Impacted cerumen, bilateral: Secondary | ICD-10-CM | POA: Diagnosis not present

## 2022-04-28 NOTE — Discharge Instructions (Addendum)
Follow-up with your primary care provider as needed.

## 2022-04-28 NOTE — ED Triage Notes (Signed)
Patient to Urgent Care with complaints of right ear pain/ blockage. Reports he was cleaning out his ears one day ago with a Q-tip and has been unable to hear out of it since. Used Ear wax remover in his ear and tried to scrape it out but was unsuccessful.

## 2022-04-28 NOTE — ED Provider Notes (Signed)
[facility]    CSN: [id number] Arrival date & time: 04/28/22  1553      History   Chief Complaint Chief Complaint  Patient presents with   Otalgia    HPI Terry Romero is a 26 y.o. male.  Patient presents with right ear feeling clogged and stopped up.  He attempted treatment with a Q-tip which made his symptoms worse.  He denies fever, chills, sore throat, cough, shortness of breath, or other symptoms.  His medical history includes rheumatoid arthritis and diverticulitis.    The history is provided by the patient and medical records.    Past Medical History:  Diagnosis Date   Rheumatoid arthritis ([place])    no meds, no limitations   Thin basement membrane disease     There are no problems to display for this patient.   Past Surgical History:  Procedure Laterality Date   BONE MARROW BIOPSY     KNEE SURGERY Right    RENAL BIOPSY         Home Medications    Prior to Admission medications   Not on File    Family History History reviewed. No pertinent family history.  Social History Social History   Tobacco Use   Smoking status: Never   Smokeless tobacco: Never  Vaping Use   Vaping Use: Every day  Substance Use Topics   Alcohol use: No     Allergies   Meperidine and related and Nsaids   Review of Systems Review of Systems  Constitutional:  Negative for chills and fever.  HENT:  Positive for ear pain. Negative for sore throat.   Respiratory:  Negative for cough and shortness of breath.   All other systems reviewed and are negative.    Physical Exam Triage Vital Signs ED Triage Vitals  Enc Vitals Group     BP      Pulse      Resp      Temp      Temp src      SpO2      Weight      Height      Head Circumference      Peak Flow      Pain Score      Pain Loc      Pain Edu?      Excl. in [place]?    No data found.  Updated Vital Signs BP 129/77   Pulse 93   Temp 97.9 F (36.6 C)   Resp 18   Ht 6' (1.829 m)   Wt 155 lb (70.3  kg)   SpO2 98%   BMI 21.02 kg/m   Visual Acuity Right Eye Distance:   Left Eye Distance:   Bilateral Distance:    Right Eye Near:   Left Eye Near:    Bilateral Near:     Physical Exam Vitals and nursing note reviewed.  Constitutional:      General: He is not in acute distress.    Appearance: Normal appearance. He is well-developed. He is not ill-appearing.  HENT:     Right Ear: There is impacted cerumen.     Left Ear: There is impacted cerumen.     Nose: Nose normal.     Mouth/Throat:     Mouth: Mucous membranes are moist.     Pharynx: Oropharynx is clear.  Cardiovascular:     Rate and Rhythm: Normal rate and regular rhythm.     Heart sounds: Normal heart sounds.  Pulmonary:     Effort: Pulmonary effort is normal. No respiratory distress.     Breath sounds: Normal breath sounds.  Musculoskeletal:     Cervical back: Neck supple.  Skin:    General: Skin is warm and dry.  Neurological:     Mental Status: He is alert.  Psychiatric:        Mood and Affect: Mood normal.        Behavior: Behavior normal.      UC Treatments / Results  Labs (all labs ordered are listed, but only abnormal results are displayed) Labs Reviewed - No data to display  EKG   Radiology No results found.  Procedures Procedures (including critical care time)  Medications Ordered in UC Medications - No data to display  Initial Impression / Assessment and Plan / UC Course  I have reviewed the triage vital signs and the nursing notes.  Pertinent labs & imaging results that were available during my care of the patient were reviewed by me and considered in my medical decision making (see chart for details).    Bilateral cerumen impaction.  Cerumen removed via irrigation by RN.  Patient reports relief of his symptoms.  TMs noted to be clear after cerumen removal.  Instructed patient to follow up with his PCP as needed.  He agrees to plan of care.    Final Clinical Impressions(s) / UC  Diagnoses   Final diagnoses:  Bilateral impacted cerumen     Discharge Instructions      Follow up with your primary care provider as needed.        ED Prescriptions   None    PDMP not reviewed this encounter.   Sharion Balloon, NP 04/28/22 [phone]

## 2022-05-06 IMAGING — CT CT ABD-PELV W/O CM
2 of 4 series · 16 of 46 positions shown, 18 images · non-contrast
Comparison: 12/15/2018

CLINICAL DATA: Left lower quadrant abdominal pain, history of
diverticulosis

EXAM:
CT ABDOMEN AND PELVIS WITHOUT CONTRAST
TECHNIQUE: Multidetector CT imaging of the abdomen and pelvis was performed
following the standard protocol without IV contrast.

[Series 2: axial st · axial · 0.69mm/px · z∈[-1035,-565]mm · 13 of 104 slices shown, 15 images]
[im 5/104  soft-tissue]
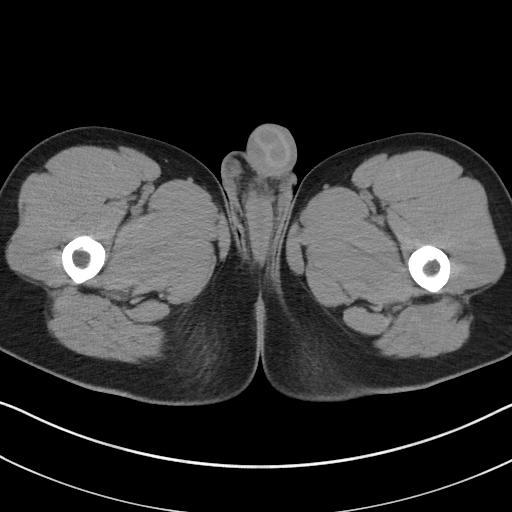
[im 5/104  bone]
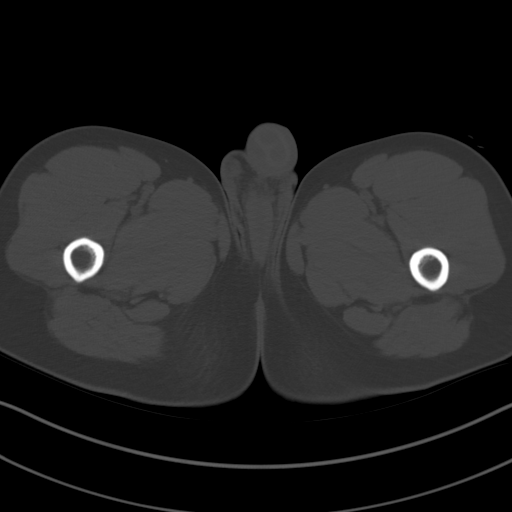
[im 13/104  soft-tissue]
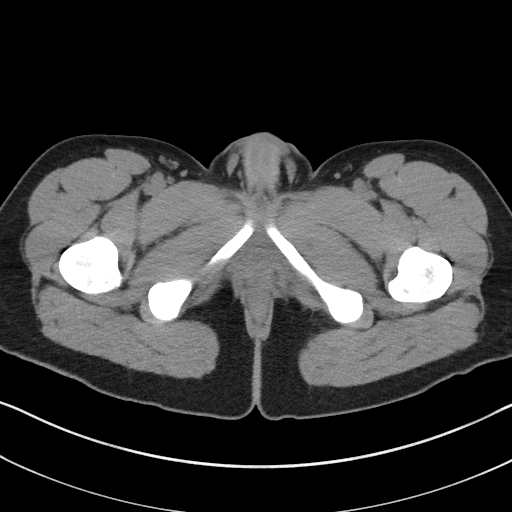
[im 21/104  soft-tissue]
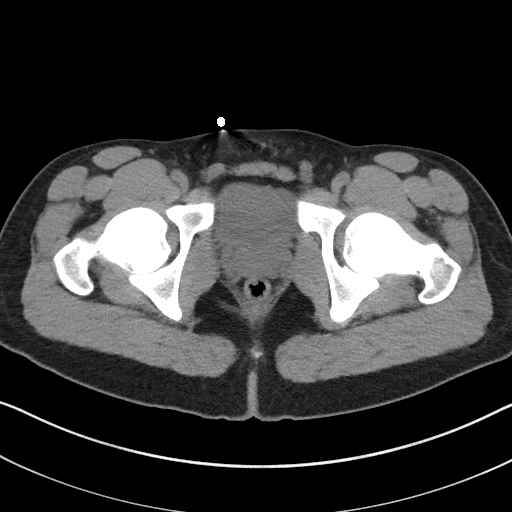
[im 29/104  soft-tissue]
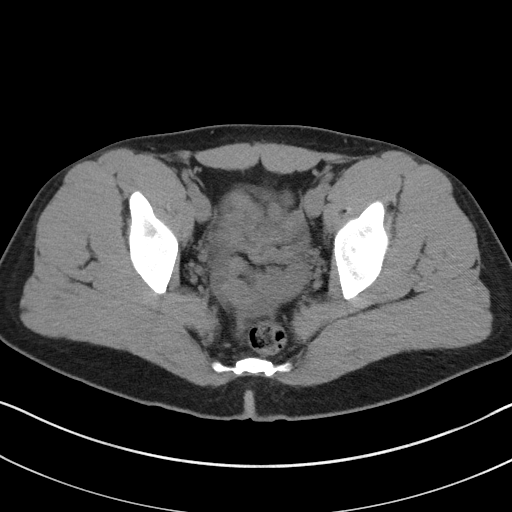
[im 38/104  soft-tissue]
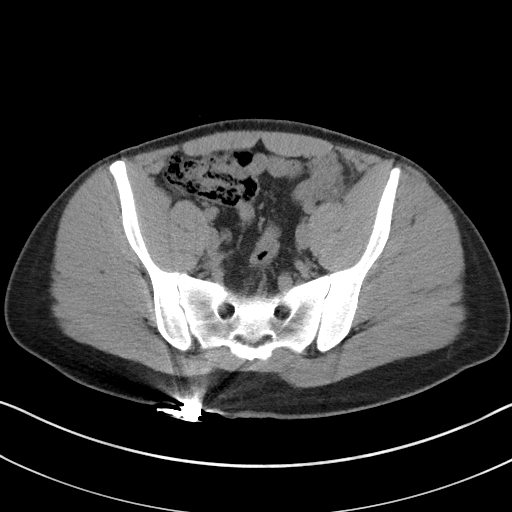
[im 46/104  soft-tissue]
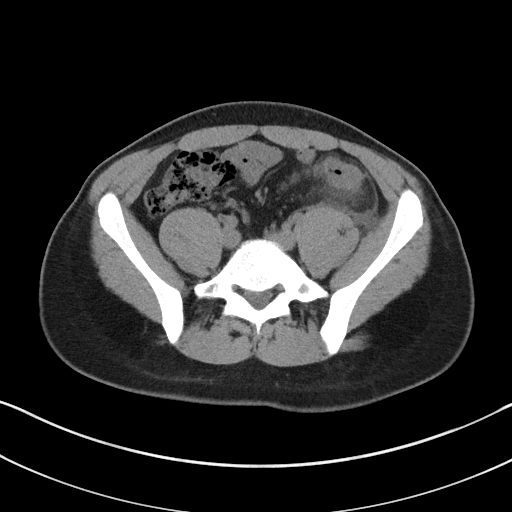
[im 54/104  soft-tissue]
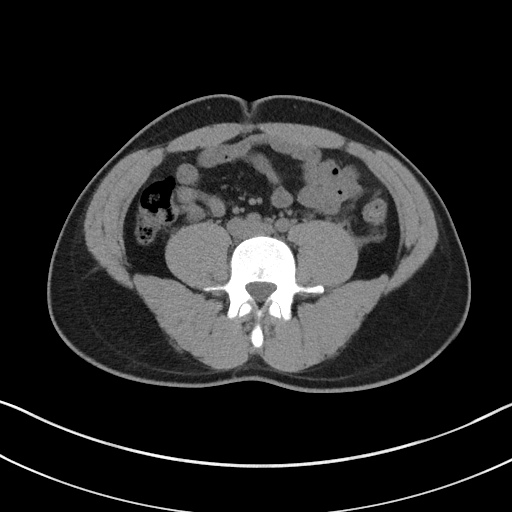
[im 58/104  soft-tissue]
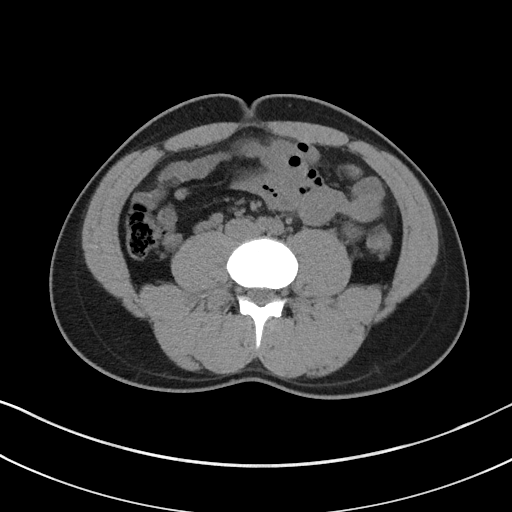
[im 66/104  soft-tissue]
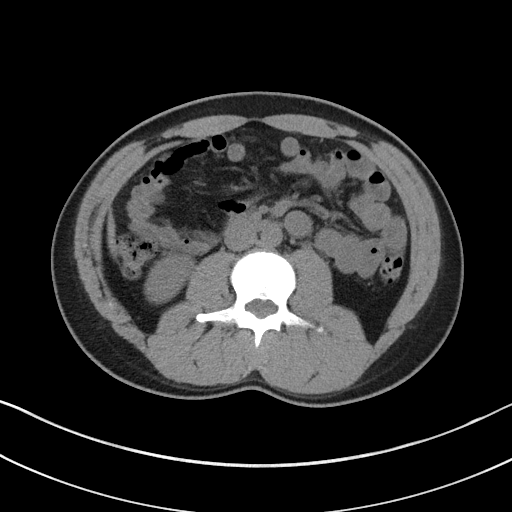
[im 66/104  bone]
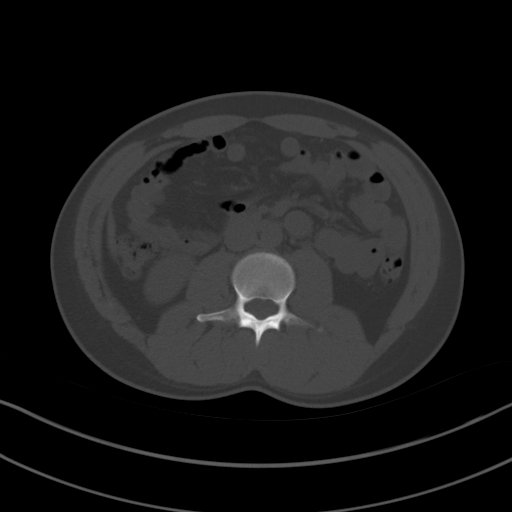
[im 75/104  soft-tissue]
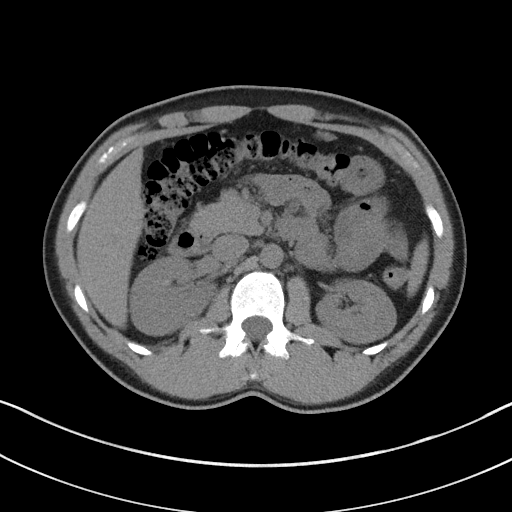
[im 83/104  soft-tissue]
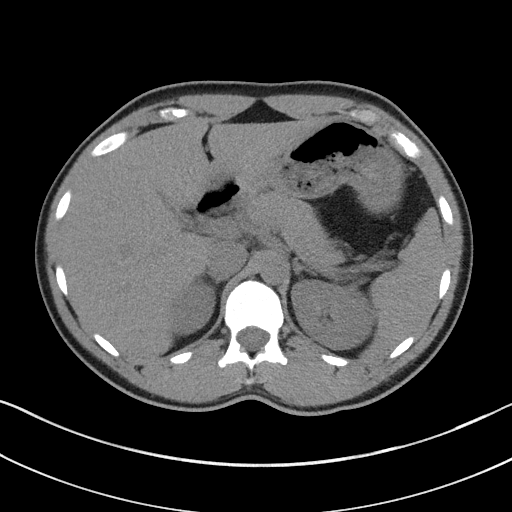
[im 91/104  soft-tissue]
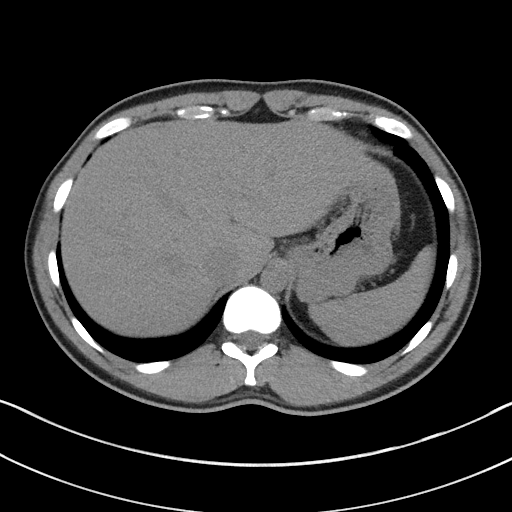
[im 99/104  soft-tissue]
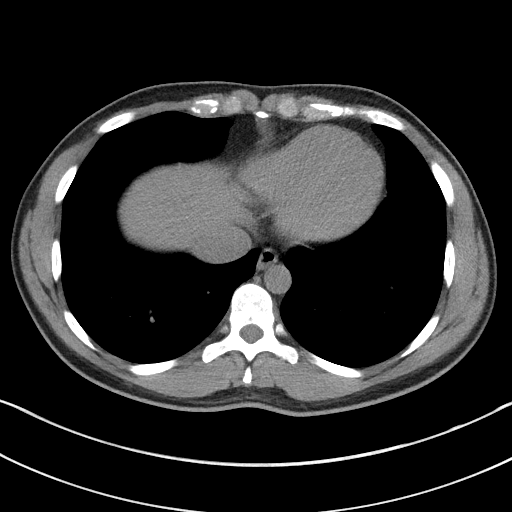

[Series 5: coronal st · coronal · 0.70mm/px · 3 of 76 slices shown]
[im 26/76  soft-tissue]
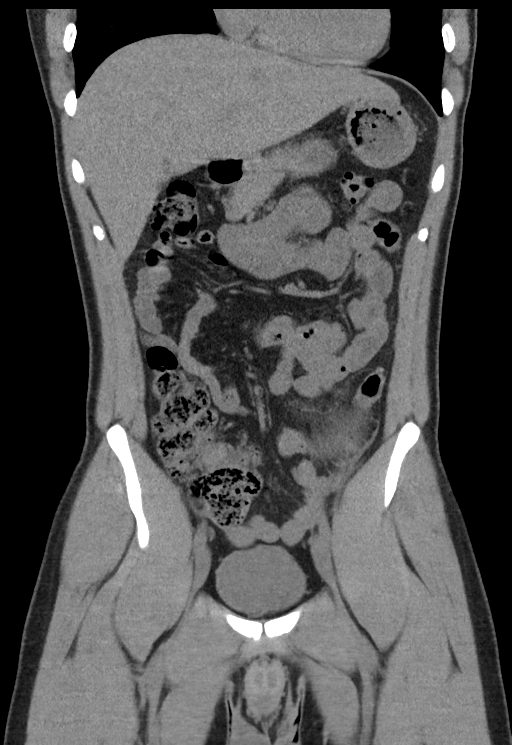
[im 34/76  soft-tissue]
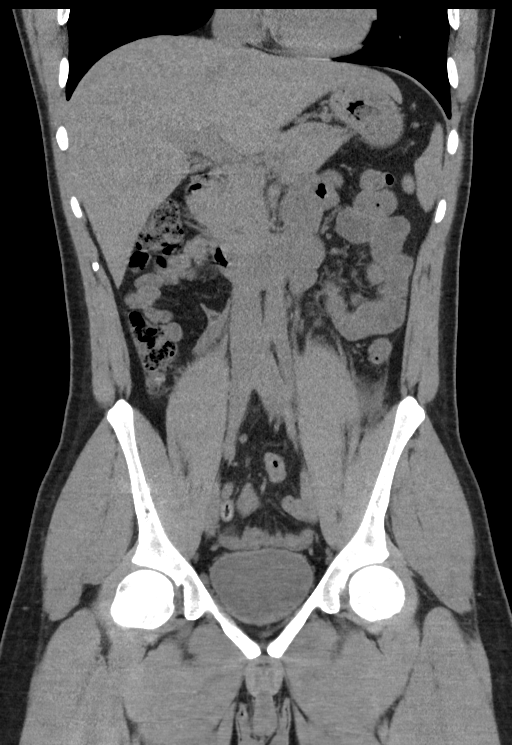
[im 42/76  soft-tissue]
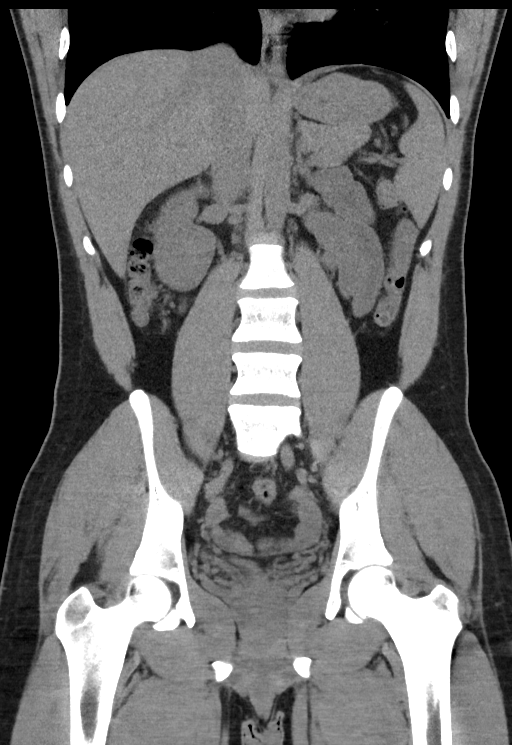

[16 of 46 positions shown; findings below may reference images not displayed]

FINDINGS: Lower chest: No acute abnormality.

Hepatobiliary: No solid liver abnormality is seen. Contracted
gallbladder. No gallstones, gallbladder wall thickening, or biliary
dilatation.

Pancreas: Unremarkable. No pancreatic ductal dilatation or
surrounding inflammatory changes.

Spleen: Normal in size without significant abnormality.

Adrenals/Urinary Tract: Adrenal glands are unremarkable. Faint
bilateral medullary calcifications. No discrete calculi or
hydronephrosis. Bladder is unremarkable.

Stomach/Bowel: Stomach is within normal limits. Appendix appears
normal. Descending and sigmoid diverticulosis. There is relatively
focal wall thickening and fat stranding about a prominent
diverticulum in the proximal sigmoid (series 2, image 61).

Vascular/Lymphatic: No significant vascular findings are present. No
enlarged abdominal or pelvic lymph nodes.

Reproductive: No mass or other significant abnormality.

Other: No abdominal wall hernia or abnormality. No abdominopelvic
ascites.

Musculoskeletal: No acute or significant osseous findings.
IMPRESSION: Descending and sigmoid diverticulosis. There is relatively focal
wall thickening and fat stranding about a prominent diverticulum in
the proximal sigmoid, consistent with acute diverticulitis. No
evidence of perforation or abscess.

## 2022-10-20 ENCOUNTER — Emergency Department
Admission: EM | Admit: 2022-10-20 | Discharge: 2022-10-20 | Disposition: A | Discharge status: Home / Self Care | Payer: Managed Care, Other (non HMO) | Attending: Emergency Medicine | Admitting: Emergency Medicine

## 2022-10-20 DIAGNOSIS — K5792 Diverticulitis of intestine, part unspecified, without perforation or abscess without bleeding: Secondary | ICD-10-CM

## 2022-10-20 DIAGNOSIS — K5732 Diverticulitis of large intestine without perforation or abscess without bleeding: Secondary | ICD-10-CM | POA: Insufficient documentation

## 2022-10-20 DIAGNOSIS — R1030 Lower abdominal pain, unspecified: Secondary | ICD-10-CM | POA: Diagnosis present

## 2022-10-20 LAB — URINALYSIS, ROUTINE W REFLEX MICROSCOPIC
Bilirubin Urine: NEGATIVE
Glucose, UA: NEGATIVE mg/dL
Hgb urine dipstick: NEGATIVE
Ketones, ur: NEGATIVE mg/dL
Leukocytes,Ua: NEGATIVE
Nitrite: NEGATIVE
Protein, ur: NEGATIVE mg/dL
Specific Gravity, Urine: 1.024 (ref 1.005–1.030)
pH: 5 (ref 5.0–8.0)

## 2022-10-20 LAB — CBC
HCT: 40.4 % (ref 39.0–52.0)
Hemoglobin: 13.7 g/dL (ref 13.0–17.0)
MCH: 26.2 pg (ref 26.0–34.0)
MCHC: 33.9 g/dL (ref 30.0–36.0)
MCV: 77.4 fL — ABNORMAL LOW (ref 80.0–100.0)
Platelets: 282 10*3/uL (ref 150–400)
RBC: 5.22 MIL/uL (ref 4.22–5.81)
RDW: 13.3 % (ref 11.5–15.5)
WBC: 12.7 10*3/uL — ABNORMAL HIGH (ref 4.0–10.5)
nRBC: 0 % (ref 0.0–0.2)

## 2022-10-20 LAB — COMPREHENSIVE METABOLIC PANEL
ALT: 38 U/L (ref 0–44)
AST: 24 U/L (ref 15–41)
Albumin: 4.3 g/dL (ref 3.5–5.0)
Alkaline Phosphatase: 74 U/L (ref 38–126)
Anion gap: 10 (ref 5–15)
BUN: 13 mg/dL (ref 6–20)
CO2: 23 mmol/L (ref 22–32)
Calcium: 9.2 mg/dL (ref 8.9–10.3)
Chloride: 102 mmol/L (ref 98–111)
Creatinine, Ser: 0.92 mg/dL (ref 0.61–1.24)
GFR, Estimated: >60 mL/min (ref >60)
Glucose, Bld: 91 mg/dL (ref 70–99)
Potassium: 3.8 mmol/L (ref 3.5–5.1)
Sodium: 135 mmol/L (ref 135–145)
Total Bilirubin: 0.9 mg/dL (ref 0.3–1.2)
Total Protein: 7.9 g/dL (ref 6.5–8.1)

## 2022-10-20 LAB — LIPASE, BLOOD: Lipase: 26 U/L (ref 11–51)

## 2022-10-20 MED ORDER — TRAMADOL HCL 50 MG PO TABS: 50.0000 mg | ORAL_TABLET | Freq: Four times a day (QID) | ORAL | 0 refills | Status: AC | PRN

## 2022-10-20 MED ORDER — AMOXICILLIN-POT CLAVULANATE 875-125 MG PO TABS: 1.0000 | ORAL_TABLET | Freq: Two times a day (BID) | ORAL | 0 refills | Status: AC

## 2022-10-20 NOTE — ED Triage Notes (Signed)
Pt sts that he thinks he is having a flare up of diverticulitis. Pt sts that he has been having really loose stool with abd pain. Pt denies N/V

## 2022-10-20 NOTE — ED Provider Notes (Signed)
[facility] Provider Note    Event Date/Time   First MD Initiated Contact with Patient 10/20/22 1458     (approximate)   History   Abdominal Pain   HPI  Terry Romero is a 26 y.o. male with a history of diverticulitis who presents with complaints of lower abdominal pain.  He reports this feels similar to his prior episodes of diverticulitis,.  He denies fevers or chills.  No nausea or vomiting.  Normal stools.  Symptoms started yesterday.     Physical Exam   Triage Vital Signs: ED Triage Vitals [10/20/22 1416]  Enc Vitals Group     BP (!) 123/91     Pulse Rate 85     Resp 18     Temp 98.5 F (36.9 C)     Temp Source Oral     SpO2 99 %     Weight 74.8 kg (165 lb)     Height      Head Circumference      Peak Flow      Pain Score 10     Pain Loc      Pain Edu?      Excl. in [place]?     Most recent vital signs: Vitals:   10/20/22 1416  BP: (!) 123/91  Pulse: 85  Resp: 18  Temp: 98.5 F (36.9 C)  SpO2: 99%     General: Awake, no distress.  CV:  Good peripheral perfusion.  Resp:  Normal effort.  Abd:  No distention.  Mild tenderness left lower quadrant Other:     ED Results / Procedures / Treatments   Labs (all labs ordered are listed, but only abnormal results are displayed) Labs Reviewed  CBC - Abnormal; Notable for the following components:      Result Value   WBC 12.7 (*)    MCV 77.4 (*)    All other components within normal limits  URINALYSIS, ROUTINE W REFLEX MICROSCOPIC - Abnormal; Notable for the following components:   Color, Urine YELLOW (*)    APPearance CLEAR (*)    All other components within normal limits  LIPASE, BLOOD  COMPREHENSIVE METABOLIC PANEL     EKG     RADIOLOGY     PROCEDURES:  Critical Care performed:   Procedures   MEDICATIONS ORDERED IN ED: Medications - No data to display   IMPRESSION / MDM / [place] / ED COURSE  I reviewed the triage vital signs and the nursing  notes. Patient's presentation is most consistent with acute presentation with potential threat to life or bodily function.  Patient presents with abdominal pain as detailed above.  History of diverticulitis and pain feels similar.  He is tender in the left lower quadrant consistent with diverticulitis.  Denies dysuria, no flank pain, no hematuria.  Lab work reviewed and is quite reassuring, patient is overall well-appearing and in no acute distress.  Mild elevation of white blood cell count, urinalysis is normal  Will start the patient on Augmentin which she reports has worked well for him before, tramadol for pain, outpatient follow-up, return precautions discussed        FINAL CLINICAL IMPRESSION(S) / ED DIAGNOSES   Final diagnoses:  Diverticulitis     Rx / DC Orders   ED Discharge Orders          Ordered    amoxicillin-clavulanate (AUGMENTIN) 875-125 MG tablet  2 times daily        10/20/22 1510  traMADol (ULTRAM) 50 MG tablet  Every 6 hours PRN        10/20/22 1510             Note:  This document was prepared using Dragon voice recognition software and may include unintentional dictation errors.   Lavonia Drafts, MD 10/20/22 [phone]

## 2023-04-15 ENCOUNTER — Ambulatory Visit
Admission: EM | Admit: 2023-04-15 | Discharge: 2023-04-15 | Disposition: A | Discharge status: Home / Self Care | Payer: Managed Care, Other (non HMO) | Attending: Emergency Medicine | Admitting: Emergency Medicine

## 2023-04-15 ENCOUNTER — Encounter: Payer: Self-pay | Admitting: Emergency Medicine

## 2023-04-15 DIAGNOSIS — R0982 Postnasal drip: Secondary | ICD-10-CM

## 2023-04-15 MED ORDER — AZITHROMYCIN 250 MG PO TABS: 250.0000 mg | ORAL_TABLET | Freq: Every day | ORAL | 0 refills | Status: DC

## 2023-04-15 NOTE — Discharge Instructions (Signed)
Today you are evaluated for your postnasal drip which could be back to related as it persisted for 2 weeks, could also possibly related to allergies as we have started to transition to fall  Begin azithromycin as directed which provides coverage for bacterial germs  Begin use of a daily antihistamine such as Claritin Zyrtec, Allegra, whichever 1 you already have available is fine to use, this medicine helps to reduce the amount of congestion that the body makes which should reduce your postnasal drip  May attempt salt water gargles, throat lozenges warm liquids to help soothe the throat as congestion may cause irritation  If symptoms improve you may cancel appointment with ear nose and throat doctor, may follow-up with this urgent care as needed

## 2023-04-15 NOTE — ED Triage Notes (Signed)
Pt presents with thick mucus drainage for 2 weeks. Pt had a virtual visit on 04/06/23 and prescribed prednisone and is not better.

## 2023-04-15 NOTE — ED Provider Notes (Signed)
Terry Romero    CSN: [id number] Arrival date & time: 04/15/23  1556      History   Chief Complaint Chief Complaint  Patient presents with  . Nasal Congestion    HPI Terry Romero is a 27 y.o. male.   Patient presents for evaluation of a persisting postnasal drip present for 2 weeks.  Initially experiencing accompanying sore throat which resolved after 3 days.  Completed a telehealth visit in which she was prescribed prednisone which provided no relief, additionally attempted use of pseudoephedrine.  Denies presence of fever chills body aches, nasal congestion, cough, ear pain.  History of a tonsillectomy.  No known sick contacts prior.  History of seasonal allergies, routinely occurring in the spring.  Past Medical History:  Diagnosis Date  . Rheumatoid arthritis (HCC)    no meds, no limitations  . Thin basement membrane disease     There are no problems to display for this patient.   Past Surgical History:  Procedure Laterality Date  . BONE MARROW BIOPSY    . KNEE SURGERY Right   . RENAL BIOPSY    . TONSILLECTOMY         Home Medications    Prior to Admission medications   Medication Sig Start Date End Date Taking? Authorizing Provider  azithromycin (ZITHROMAX) 250 MG tablet Take 1 tablet (250 mg total) by mouth daily. Take first 2 tablets together, then 1 every day until finished. 04/15/23  Yes Shelden Raborn, Elita Boone, NP  traMADol (ULTRAM) 50 MG tablet Take 1 tablet (50 mg total) by mouth every 6 (six) hours as needed. 10/20/22 10/20/23  Jene Every, MD    Family History No family history on file.  Social History Social History   Tobacco Use  . Smoking status: Never  . Smokeless tobacco: Never  Vaping Use  . Vaping status: Every Day  Substance Use Topics  . Alcohol use: Yes    Comment: social     Allergies   Meperidine and related, Nsaids, and Tolmetin   Review of Systems Review of Systems   Physical Exam Triage Vital Signs ED Triage  Vitals  Encounter Vitals Group     BP 04/15/23 1613 138/88     Systolic BP Percentile --      Diastolic BP Percentile --      Pulse Rate 04/15/23 1613 87     Resp 04/15/23 1613 16     Temp 04/15/23 1613 99 F (37.2 C)     Temp Source 04/15/23 1613 Oral     SpO2 04/15/23 1613 96 %     Weight --      Height --      Head Circumference --      Peak Flow --      Pain Score 04/15/23 1612 0     Pain Loc --      Pain Education --      Exclude from Growth Chart --    No data found.  Updated Vital Signs BP 138/88 (BP Location: Left Arm)   Pulse 87   Temp 99 F (37.2 C) (Oral)   Resp 16   SpO2 96%   Visual Acuity Right Eye Distance:   Left Eye Distance:   Bilateral Distance:    Right Eye Near:   Left Eye Near:    Bilateral Near:     Physical Exam Constitutional:      Appearance: Normal appearance.  HENT:     Right Ear: Tympanic membrane, ear  canal and external ear normal.     Left Ear: Tympanic membrane, ear canal and external ear normal.     Nose: Congestion present. No rhinorrhea.     Mouth/Throat:     Mouth: Mucous membranes are moist.     Pharynx: Posterior oropharyngeal erythema present. No oropharyngeal exudate.  Eyes:     Extraocular Movements: Extraocular movements intact.  Cardiovascular:     Rate and Rhythm: Normal rate and regular rhythm.     Pulses: Normal pulses.     Heart sounds: Normal heart sounds.  Pulmonary:     Effort: Pulmonary effort is normal.     Breath sounds: Normal breath sounds.  Neurological:     Mental Status: He is alert.     UC Treatments / Results  Labs (all labs ordered are listed, but only abnormal results are displayed) Labs Reviewed - No data to display  EKG   Radiology No results found.  Procedures Procedures (including critical care time)  Medications Ordered in UC Medications - No data to display  Initial Impression / Assessment and Plan / UC Course  I have reviewed the triage vital signs and the nursing  notes.  Pertinent labs & imaging results that were available during my care of the patient were reviewed by me and considered in my medical decision making (see chart for details).  Postnasal drip  Symptoms present for 2 weeks, etiology at this time most likely bacterial versus allergies, discussed this with patient, prophylactically providing azithromycin and recommended beginning use of daily antihistamine, recommended supportive measures for any further throat irritation or pain, advised follow-up with urgent care as needed, has upcoming ear nose and throat appointment, may keep if wanting further evaluation Final Clinical Impressions(s) / UC Diagnoses   Final diagnoses:  Post-nasal drip     Discharge Instructions      Today you are evaluated for your postnasal drip which could be back to related as it persisted for 2 weeks, could also possibly related to allergies as we have started to transition to fall  Begin azithromycin as directed which provides coverage for bacterial germs  Begin use of a daily antihistamine such as Claritin Zyrtec, Allegra, whichever 1 you already have available is fine to use, this medicine helps to reduce the amount of congestion that the body makes which should reduce your postnasal drip  May attempt salt water gargles, throat lozenges warm liquids to help soothe the throat as congestion may cause irritation  If symptoms improve you may cancel appointment with ear nose and throat doctor, may follow-up with this urgent care as needed    ED Prescriptions     Medication Sig Dispense Auth. Provider   azithromycin (ZITHROMAX) 250 MG tablet Take 1 tablet (250 mg total) by mouth daily. Take first 2 tablets together, then 1 every day until finished. 6 tablet Valinda Hoar, NP      PDMP not reviewed this encounter.   Valinda Hoar, [place] 04/15/23 [phone]

## 2023-08-26 ENCOUNTER — Ambulatory Visit
Admission: EM | Admit: 2023-08-26 | Discharge: 2023-08-26 | Disposition: A | Discharge status: Home / Self Care | Payer: Managed Care, Other (non HMO) | Attending: Emergency Medicine | Admitting: Emergency Medicine

## 2023-08-26 DIAGNOSIS — J069 Acute upper respiratory infection, unspecified: Secondary | ICD-10-CM | POA: Diagnosis not present

## 2023-08-26 LAB — POC COVID19/FLU A&B COMBO
Covid Antigen, POC: NEGATIVE
Influenza A Antigen, POC: NEGATIVE
Influenza B Antigen, POC: NEGATIVE

## 2023-08-26 LAB — POCT RAPID STREP A (OFFICE): Rapid Strep A Screen: NEGATIVE

## 2023-08-26 NOTE — ED Provider Notes (Signed)
Renaldo Fiddler    CSN: [id number] Arrival date & time: 08/26/23  1653      History   Chief Complaint Chief Complaint  Patient presents with   URI    HPI Terry Romero is a 28 y.o. male.  Patient presents on day 3 of congestion, runny nose, postnasal drip, sore throat, cough.  No OTC medications taken today.  No fever or shortness of breath.  [facility]; diagnosed with upper respiratory infection and headache; negative flu test; treated with saline nasal spray and Sudafed.  The history is provided by the patient and medical records.    Past Medical History:  Diagnosis Date   Rheumatoid arthritis (HCC)    no meds, no limitations   Thin basement membrane disease     There are no active problems to display for this patient.   Past Surgical History:  Procedure Laterality Date   BONE MARROW BIOPSY     KNEE SURGERY Right    RENAL BIOPSY     TONSILLECTOMY         Home Medications    Prior to Admission medications   Medication Sig Start Date End Date Taking? Authorizing Provider  azithromycin (ZITHROMAX) 250 MG tablet Take 1 tablet (250 mg total) by mouth daily. Take first 2 tablets together, then 1 every day until finished. Patient not taking: Reported on 08/26/2023 04/15/23   Valinda Hoar, NP  traMADol (ULTRAM) 50 MG tablet Take 1 tablet (50 mg total) by mouth every 6 (six) hours as needed. 10/20/22 10/20/23  Jene Every, MD    Family History History reviewed. No pertinent family history.  Social History Social History   Tobacco Use   Smoking status: Never   Smokeless tobacco: Never  Vaping Use   Vaping status: Every Day  Substance Use Topics   Alcohol use: Yes    Comment: social     Allergies   Meperidine and related, Nsaids, and Tolmetin   Review of Systems Review of Systems  Constitutional:  Negative for chills and fever.  HENT:  Positive for congestion, postnasal drip, rhinorrhea and sore throat.  Negative for ear pain.   Respiratory:  Positive for cough. Negative for shortness of breath.      Physical Exam Triage Vital Signs ED Triage Vitals [08/26/23 1724]  Encounter Vitals Group     BP 120/80     Systolic BP Percentile      Diastolic BP Percentile      Pulse Rate (!) 102     Resp 18     Temp 99.5 F (37.5 C)     Temp src      SpO2 97 %     Weight      Height      Head Circumference      Peak Flow      Pain Score 0     Pain Loc      Pain Education      Exclude from Growth Chart    No data found.  Updated Vital Signs BP 120/80   Pulse (!) 102   Temp 99.5 F (37.5 C)   Resp 18   SpO2 97%   Visual Acuity Right Eye Distance:   Left Eye Distance:   Bilateral Distance:    Right Eye Near:   Left Eye Near:    Bilateral Near:     Physical Exam Constitutional:      General: He is not in  acute distress. HENT:     Right Ear: Tympanic membrane normal.     Left Ear: Tympanic membrane normal.     Nose: Rhinorrhea present.     Mouth/Throat:     Mouth: Mucous membranes are moist.     Pharynx: Oropharynx is clear.  Cardiovascular:     Rate and Rhythm: Normal rate and regular rhythm.     Heart sounds: Normal heart sounds.  Pulmonary:     Effort: Pulmonary effort is normal. No respiratory distress.     Breath sounds: Normal breath sounds.  Neurological:     Mental Status: He is alert.      UC Treatments / Results  Labs (all labs ordered are listed, but only abnormal results are displayed) Labs Reviewed  POC COVID19/FLU A&B COMBO - Normal  POCT RAPID STREP A (OFFICE)    EKG   Radiology No results found.  Procedures Procedures (including critical care time)  Medications Ordered in UC Medications - No data to display  Initial Impression / Assessment and Plan / UC Course  I have reviewed the triage vital signs and the nursing notes.  Pertinent labs & imaging results that were available during my care of the patient were reviewed by me and  considered in my medical decision making (see chart for details).    Viral URI.  Rapid COVID and flu negative.  Rapid strep negative.  Discussed symptomatic treatment including Tylenol or ibuprofen as needed for fever or discomfort, plain Mucinex as needed for congestion, rest, hydration.  Instructed patient to follow-up with PCP if not improving.  ED precautions given.  Patient agrees to plan of care.   Final Clinical Impressions(s) / UC Diagnoses   Final diagnoses:  Viral URI     Discharge Instructions      The strep, COVID and flu tests are negative.   Take Tylenol or ibuprofen as needed for fever or discomfort.  Take plain Mucinex as needed for congestion.  Rest and keep yourself hydrated.    Follow-up with your primary care provider if your symptoms are not improving.         ED Prescriptions   None    PDMP not reviewed this encounter.   Mickie Bail, NP 08/26/23 1750

## 2023-08-26 NOTE — Discharge Instructions (Addendum)
The strep, COVID and flu tests are negative.   Take Tylenol or ibuprofen as needed for fever or discomfort.  Take plain Mucinex as needed for congestion.  Rest and keep yourself hydrated.    Follow-up with your primary care provider if your symptoms are not improving.

## 2023-08-26 NOTE — ED Triage Notes (Signed)
Patient to Urgent Care with complaints of nasal congestion/ runny nose/ drainage/ feeling feverish (unknown if any fevers)/ sore throat.  Symptoms started 3 days ago. Feeling worse today.   Taking otc cold/ flu medication.

## 2023-10-19 ENCOUNTER — Emergency Department
Admission: EM | Admit: 2023-10-19 | Discharge: 2023-10-19 | Disposition: A | Discharge status: Home / Self Care | Attending: Emergency Medicine | Admitting: Emergency Medicine

## 2023-10-19 ENCOUNTER — Other Ambulatory Visit: Payer: Self-pay

## 2023-10-19 ENCOUNTER — Emergency Department

## 2023-10-19 ENCOUNTER — Encounter: Payer: Self-pay | Admitting: Emergency Medicine

## 2023-10-19 DIAGNOSIS — K5732 Diverticulitis of large intestine without perforation or abscess without bleeding: Secondary | ICD-10-CM | POA: Diagnosis not present

## 2023-10-19 DIAGNOSIS — R7309 Other abnormal glucose: Secondary | ICD-10-CM | POA: Insufficient documentation

## 2023-10-19 DIAGNOSIS — K5792 Diverticulitis of intestine, part unspecified, without perforation or abscess without bleeding: Secondary | ICD-10-CM

## 2023-10-19 DIAGNOSIS — R109 Unspecified abdominal pain: Secondary | ICD-10-CM | POA: Diagnosis present

## 2023-10-19 DIAGNOSIS — D72829 Elevated white blood cell count, unspecified: Secondary | ICD-10-CM | POA: Diagnosis not present

## 2023-10-19 LAB — URINALYSIS, ROUTINE W REFLEX MICROSCOPIC
Bilirubin Urine: NEGATIVE
Glucose, UA: NEGATIVE mg/dL
Hgb urine dipstick: NEGATIVE
Ketones, ur: NEGATIVE mg/dL
Leukocytes,Ua: NEGATIVE
Nitrite: NEGATIVE
Protein, ur: NEGATIVE mg/dL
Specific Gravity, Urine: 1.017 (ref 1.005–1.030)
pH: 5 (ref 5.0–8.0)

## 2023-10-19 LAB — CBC
HCT: 38.3 % — ABNORMAL LOW (ref 39.0–52.0)
Hemoglobin: 13.4 g/dL (ref 13.0–17.0)
MCH: 26.6 pg (ref 26.0–34.0)
MCHC: 35 g/dL (ref 30.0–36.0)
MCV: 76 fL — ABNORMAL LOW (ref 80.0–100.0)
Platelets: 272 10*3/uL (ref 150–400)
RBC: 5.04 MIL/uL (ref 4.22–5.81)
RDW: 14 % (ref 11.5–15.5)
WBC: 11.6 10*3/uL — ABNORMAL HIGH (ref 4.0–10.5)
nRBC: 0 % (ref 0.0–0.2)

## 2023-10-19 LAB — COMPREHENSIVE METABOLIC PANEL
ALT: 26 U/L (ref 0–44)
AST: 21 U/L (ref 15–41)
Albumin: 4.3 g/dL (ref 3.5–5.0)
Alkaline Phosphatase: 54 U/L (ref 38–126)
Anion gap: 8 (ref 5–15)
BUN: 10 mg/dL (ref 6–20)
CO2: 26 mmol/L (ref 22–32)
Calcium: 9.2 mg/dL (ref 8.9–10.3)
Chloride: 102 mmol/L (ref 98–111)
Creatinine, Ser: 0.82 mg/dL (ref 0.61–1.24)
GFR, Estimated: >60 mL/min (ref >60)
Glucose, Bld: 104 mg/dL — ABNORMAL HIGH (ref 70–99)
Potassium: 4.1 mmol/L (ref 3.5–5.1)
Sodium: 136 mmol/L (ref 135–145)
Total Bilirubin: 0.9 mg/dL (ref 0.0–1.2)
Total Protein: 7.3 g/dL (ref 6.5–8.1)

## 2023-10-19 LAB — LIPASE, BLOOD: Lipase: 25 U/L (ref 11–51)

## 2023-10-19 MED ORDER — ACETAMINOPHEN 325 MG PO TABS
650.0000 mg | ORAL_TABLET | Freq: Once | ORAL | Status: AC
  Administered 2023-10-19: 650 mg via ORAL
  Filled 2023-10-19: qty 2

## 2023-10-19 MED ORDER — AMOXICILLIN-POT CLAVULANATE 875-125 MG PO TABS
1.0000 | ORAL_TABLET | Freq: Once | ORAL | Status: AC
  Administered 2023-10-19: 1 via ORAL
  Filled 2023-10-19: qty 1

## 2023-10-19 MED ORDER — AMOXICILLIN-POT CLAVULANATE 875-125 MG PO TABS: 1.0000 | ORAL_TABLET | Freq: Two times a day (BID) | ORAL | 0 refills | Status: AC

## 2023-10-19 NOTE — Discharge Instructions (Addendum)
 Please take the antibiotics as prescribed.  I have attached information for gastroenterology.  You can schedule appointment with them if you want to look into further treatment options for the diverticulitis.  If you are interested in a functional medicine approach, go to [email or web address] to find a functional medicine provider near you.

## 2023-10-19 NOTE — ED Notes (Signed)
 Pt verbalizes understanding of discharge instructions. Opportunity for questioning and answers were provided. Pt discharged from ED to home.   ? ?

## 2023-10-19 NOTE — ED Provider Notes (Signed)
 [facility] Provider Note    Event Date/Time   First MD Initiated Contact with Patient 10/19/23 1631     (approximate)   History   Abdominal Pain   HPI  Terry Romero is a 28 y.o. male with PMH of diverticulitis, arthritis, thin basement membrane nephropathy who presents for evaluation of abdominal pain.  Patient attributes this pain to a diverticulitis flare.  He states that the pain is in the same area that it usually is but the pain is more severe.  Has a sharp pain in the left lower quadrant that extends across the lower abdomen.  No episodes of nausea, vomiting or diarrhea.  Patient denies fevers at home.      Physical Exam   Triage Vital Signs: ED Triage Vitals  Encounter Vitals Group     BP 10/19/23 1244 (!) 122/93     Systolic BP Percentile --      Diastolic BP Percentile --      Pulse Rate 10/19/23 1244 84     Resp 10/19/23 1244 18     Temp 10/19/23 1244 98.7 F (37.1 C)     Temp Source 10/19/23 1244 Oral     SpO2 10/19/23 1244 100 %     Weight 10/19/23 1245 174 lb (78.9 kg)     Height 10/19/23 1245 5\' 11"  (1.803 m)     Head Circumference --      Peak Flow --      Pain Score 10/19/23 1256 10     Pain Loc --      Pain Education --      Exclude from Growth Chart --     Most recent vital signs: Vitals:   10/19/23 1244  BP: (!) 122/93  Pulse: 84  Resp: 18  Temp: 98.7 F (37.1 C)  SpO2: 100%   General: Awake, no distress.  CV:  Good peripheral perfusion.  RRR. Resp:  Normal effort.  CTAB. Abd:  No distention.  Soft, very tender to palpation in left lower quadrant. Other:     ED Results / Procedures / Treatments   Labs (all labs ordered are listed, but only abnormal results are displayed) Labs Reviewed  COMPREHENSIVE METABOLIC PANEL - Abnormal; Notable for the following components:      Result Value   Glucose, Bld 104 (*)    All other components within normal limits  CBC - Abnormal; Notable for the following components:    WBC 11.6 (*)    HCT 38.3 (*)    MCV 76.0 (*)    All other components within normal limits  URINALYSIS, ROUTINE W REFLEX MICROSCOPIC - Abnormal; Notable for the following components:   Color, Urine YELLOW (*)    APPearance CLEAR (*)    All other components within normal limits  LIPASE, BLOOD     RADIOLOGY  CT abdomen pelvis obtained, I interpreted the images as well as reviewed the radiologist report which showed changes consistent with acute diverticulitis within the distal descending and proximal sigmoid colon.  There is no evidence of perforation or abscess.   PROCEDURES:  Critical Care performed: No  Procedures   MEDICATIONS ORDERED IN ED: Medications - No data to display   IMPRESSION / MDM / ASSESSMENT AND PLAN / ED COURSE  I reviewed the triage vital signs and the nursing notes.  28 year old male presents for evaluation of abdominal pain.  Vital signs are stable patient NAD on exam.  Differential diagnosis includes, but is not limited to, diverticulitis, colitis, appendicitis, small bowel obstruction or ileus.  Patient's presentation is most consistent with acute complicated illness / injury requiring diagnostic workup.  CMP unremarkable aside from slightly elevated glucose.  CBC with mild leukocytosis.  Lipase not elevated.  Urinalysis without signs of infection.  Patient is very tender to palpation in the left lower quadrant.  He reports that this pain is consistent with previous diverticulitis flares.  Spoke with my attending physician, Dr. Cyril Loosen, who recommended Noncon CT scan.  CT scan showed changes consistent with diverticulitis without abscess or perforation.  Patient will be started on a course of oral antibiotics.  I offered pain medication which he declined.  Patient was given contact information for GI.  He voiced understanding, all questions were answered and he was stable at discharge.    FINAL CLINICAL IMPRESSION(S) / ED  DIAGNOSES   Final diagnoses:  Diverticulitis     Rx / DC Orders   ED Discharge Orders          Ordered    amoxicillin-clavulanate (AUGMENTIN) 875-125 MG tablet  2 times daily        10/19/23 1645             Note:  This document was prepared using Dragon voice recognition software and may include unintentional dictation errors.   Rosalee Kaufman 10/19/23 Jay Schlichter, MD 10/19/23 2128

## 2023-10-19 NOTE — ED Notes (Signed)
 See triage notes. Patient c/o lower abdominal pain.

## 2023-10-19 NOTE — ED Triage Notes (Signed)
 Patient to ED via POV for lower abd pain. Started at 5am. Denies N/V/D. Hx of diverticulitis.

## 2023-12-24 ENCOUNTER — Ambulatory Visit
Admission: EM | Admit: 2023-12-24 | Discharge: 2023-12-24 | Disposition: A | Discharge status: Home / Self Care | Attending: Emergency Medicine | Admitting: Emergency Medicine

## 2023-12-24 DIAGNOSIS — R1032 Left lower quadrant pain: Secondary | ICD-10-CM

## 2023-12-24 MED ORDER — AMOXICILLIN-POT CLAVULANATE 875-125 MG PO TABS: 1.0000 | ORAL_TABLET | Freq: Two times a day (BID) | ORAL | 0 refills | Status: DC

## 2023-12-24 NOTE — Discharge Instructions (Signed)
 Your evaluated for your abdominal pain and presentation and exam is consistent with diverticulitis therefore will empirically treat with antibiotics  Take Augmentin  twice daily for 7 days as directed  As always avoid seated food as this will cause further irritation to the abdomen  May take Tylenol  and or Motrin as needed for pain  If your symptoms continue to persist please reach out to your GI specialist, at any point for any worsening symptoms please go to the nearest emergency department for evaluation

## 2023-12-24 NOTE — ED Triage Notes (Signed)
 LLQ cramping and pain x 3 days.

## 2023-12-24 NOTE — ED Provider Notes (Signed)
 Terry Romero    CSN: [id number] Arrival date & time: 12/24/23  1722      History   Chief Complaint Chief Complaint  Patient presents with   Abdominal Pain    HPI Terry Romero is a 28 y.o. male.   Patient presents for evaluation of constant lower abdominal cramping and tenderness to the left lower quadrant when touch present for 3 days.  Persistent mucus within the stool but denies presence of blood.  History of reoccurring diverticulitis, last treatment completed and March 2025, followed by GI.  Denies fever, nausea vomiting, diarrhea.   Past Medical History:  Diagnosis Date   Rheumatoid arthritis (HCC)    no meds, no limitations   Thin basement membrane disease     There are no active problems to display for this patient.   Past Surgical History:  Procedure Laterality Date   BONE MARROW BIOPSY     KNEE SURGERY Right    RENAL BIOPSY     TONSILLECTOMY         Home Medications    Prior to Admission medications   Medication Sig Start Date End Date Taking? Authorizing Provider  amoxicillin -clavulanate (AUGMENTIN ) 875-125 MG tablet Take 1 tablet by mouth every 12 (twelve) hours. 12/24/23  Yes Sophia Sperry R, NP  azithromycin  (ZITHROMAX ) 250 MG tablet Take 1 tablet (250 mg total) by mouth daily. Take first 2 tablets together, then 1 every day until finished. Patient not taking: Reported on 08/26/2023 04/15/23   Reena Canning, NP    Family History History reviewed. No pertinent family history.  Social History Social History   Tobacco Use   Smoking status: Never   Smokeless tobacco: Never  Vaping Use   Vaping status: Every Day  Substance Use Topics   Alcohol use: Yes    Comment: social   Drug use: Never     Allergies   Meperidine and related, Nsaids, and Tolmetin   Review of Systems Review of Systems   Physical Exam Triage Vital Signs ED Triage Vitals [12/24/23 1741]  Encounter Vitals Group     BP 121/78     Systolic BP Percentile       Diastolic BP Percentile      Pulse Rate 88     Resp 16     Temp 99.3 F (37.4 C)     Temp Source Oral     SpO2 98 %     Weight      Height      Head Circumference      Peak Flow      Pain Score 6     Pain Loc      Pain Education      Exclude from Growth Chart    No data found.  Updated Vital Signs BP 121/78 (BP Location: Left Arm)   Pulse 88   Temp 99.3 F (37.4 C) (Oral)   Resp 16   SpO2 98%   Visual Acuity Right Eye Distance:   Left Eye Distance:   Bilateral Distance:    Right Eye Near:   Left Eye Near:    Bilateral Near:     Physical Exam Constitutional:      Appearance: Normal appearance.  Eyes:     Extraocular Movements: Extraocular movements intact.  Pulmonary:     Effort: Pulmonary effort is normal.  Abdominal:     General: Abdomen is flat. Bowel sounds are increased.     Palpations: Abdomen is soft.  Tenderness: There is abdominal tenderness in the left lower quadrant.  Neurological:     Mental Status: He is alert and oriented to person, place, and time. Mental status is at baseline.      UC Treatments / Results  Labs (all labs ordered are listed, but only abnormal results are displayed) Labs Reviewed - No data to display  EKG   Radiology No results found.  Procedures Procedures (including critical care time)  Medications Ordered in UC Medications - No data to display  Initial Impression / Assessment and Plan / UC Course  I have reviewed the triage vital signs and the nursing notes.  Pertinent labs & imaging results that were available during my care of the patient were reviewed by me and considered in my medical decision making (see chart for details).  Lower Quadrant abdominal pain  Abdominal tenderness noted on exam, able to sit comfortably within exam room, not guarding, stable for outpatient management, vital signs are within normal ranges, low suspicion for sepsis, will move forward with antibiotic treatment,  intolerable to metronidazole and Cipro, prescribing Augmentin , has had success within the past, recommended supportive care for persisting symptoms patient advised to follow-up with his GI specialist and for worsening symptoms to go to the nearest emergency department, verbalized understanding Final Clinical Impressions(s) / UC Diagnoses   Final diagnoses:  LLQ abdominal pain   Discharge Instructions      Your evaluated for your abdominal pain and presentation and exam is consistent with diverticulitis therefore will empirically treat with antibiotics  Take Augmentin  twice daily for 7 days as directed  As always avoid seated food as this will cause further irritation to the abdomen  May take Tylenol  and or Motrin as needed for pain  If your symptoms continue to persist please reach out to your GI specialist, at any point for any worsening symptoms please go to the nearest emergency department for evaluation  ED Prescriptions     Medication Sig Dispense Auth. Provider   amoxicillin -clavulanate (AUGMENTIN ) 875-125 MG tablet Take 1 tablet by mouth every 12 (twelve) hours. 14 tablet Jakeisha Stricker R, NP      PDMP not reviewed this encounter.   Reena Canning, NP 12/24/23 1756

## 2024-08-20 ENCOUNTER — Ambulatory Visit
Admission: EM | Admit: 2024-08-20 | Discharge: 2024-08-20 | Disposition: A | Discharge status: Home / Self Care | Attending: Family Medicine | Admitting: Family Medicine

## 2024-08-20 ENCOUNTER — Encounter: Payer: Self-pay | Admitting: Emergency Medicine

## 2024-08-20 DIAGNOSIS — U071 COVID-19: Secondary | ICD-10-CM | POA: Diagnosis not present

## 2024-08-20 LAB — POC COVID19/FLU A&B COMBO
Covid Antigen, POC: POSITIVE — AB
Influenza A Antigen, POC: NEGATIVE
Influenza B Antigen, POC: NEGATIVE

## 2024-08-20 MED ORDER — BENZONATATE 200 MG PO CAPS: 200.0000 mg | ORAL_CAPSULE | Freq: Three times a day (TID) | ORAL | 0 refills | Status: AC | PRN

## 2024-08-20 MED ORDER — PAXLOVID (300/100) 20 X 150 MG & 10 X 100MG PO TBPK: 3.0000 | ORAL_TABLET | Freq: Two times a day (BID) | ORAL | 0 refills | Status: AC

## 2024-08-20 NOTE — ED Provider Notes (Addendum)
 " [facility]    CSN: [id number] Arrival date & time: 08/20/24  1412      History   Chief Complaint Chief Complaint  Patient presents with   URI    HPI Terry Romero is a 29 y.o. male  presents for evaluation of URI symptoms for 1 days. Patient reports associated symptoms of cough, congestion, headache, low-grade fever. Denies N/V/D, sore throat, ear pain, body aches or shortness of breath. Patient does not have a hx of asthma. Patient is not an active smoker.   Reports no known sick contacts.  Pt has taken allergy medicine and Alka-Seltzer OTC for symptoms. Pt has no other concerns at this time.    URI Presenting symptoms: congestion, cough and fever   Associated symptoms: headaches     Past Medical History:  Diagnosis Date   Rheumatoid arthritis (HCC)    no meds, no limitations   Thin basement membrane disease     There are no active problems to display for this patient.   Past Surgical History:  Procedure Laterality Date   BONE MARROW BIOPSY     KNEE SURGERY Right    RENAL BIOPSY     TONSILLECTOMY         Home Medications    Prior to Admission medications  Medication Sig Start Date End Date Taking? Authorizing Provider  benzonatate  (TESSALON ) 200 MG capsule Take 1 capsule (200 mg total) by mouth 3 (three) times daily as needed. 08/20/24  Yes Amberley Hamler, Jodi R, NP  nirmatrelvir/ritonavir (PAXLOVID , 300/100,) 20 x 150 MG & 10 x 100MG  TBPK Take 3 tablets by mouth 2 (two) times daily for 5 days. Take nirmatrelvir (150 mg) two tablets twice daily for 5 days and ritonavir (100 mg) one tablet twice daily for 5 days. 08/20/24 08/25/24 Yes Loreda Myla SAUNDERS, NP    Family History History reviewed. No pertinent family history.  Social History Social History[1]   Allergies   Meperidine and related, Nsaids, and Tolmetin   Review of Systems Review of Systems  Constitutional:  Positive for fever.  HENT:  Positive for congestion.   Respiratory:  Positive for  cough.   Neurological:  Positive for headaches.     Physical Exam Triage Vital Signs ED Triage Vitals  Encounter Vitals Group     BP 08/20/24 1438 121/78     Girls Systolic BP Percentile --      Girls Diastolic BP Percentile --      Boys Systolic BP Percentile --      Boys Diastolic BP Percentile --      Pulse Rate 08/20/24 1438 (!) 107     Resp --      Temp 08/20/24 1438 98.8 F (37.1 C)     Temp Source 08/20/24 1438 Oral     SpO2 08/20/24 1438 99 %     Weight --      Height --      Head Circumference --      Peak Flow --      Pain Score 08/20/24 1437 0     Pain Loc --      Pain Education --      Exclude from Growth Chart --    No data found.  Updated Vital Signs BP 121/78 (BP Location: Left Arm)   Pulse (!) 107   Temp 98.8 F (37.1 C) (Oral)   SpO2 99%   Visual Acuity Right Eye Distance:   Left Eye Distance:   Bilateral Distance:  Right Eye Near:   Left Eye Near:    Bilateral Near:     Physical Exam Vitals and nursing note reviewed.  Constitutional:      General: He is not in acute distress.    Appearance: Normal appearance. He is not ill-appearing or toxic-appearing.  HENT:     Head: Normocephalic and atraumatic.     Right Ear: Tympanic membrane and ear canal normal.     Left Ear: Tympanic membrane and ear canal normal.     Nose: Congestion present.     Mouth/Throat:     Mouth: Mucous membranes are moist.     Pharynx: Posterior oropharyngeal erythema present.  Eyes:     Pupils: Pupils are equal, round, and reactive to light.  Cardiovascular:     Rate and Rhythm: Regular rhythm. Tachycardia present.     Heart sounds: Normal heart sounds.     Comments: Heart rate 107 Pulmonary:     Effort: Pulmonary effort is normal.     Breath sounds: Normal breath sounds. No wheezing, rhonchi or rales.  Musculoskeletal:     Cervical back: Normal range of motion and neck supple.  Lymphadenopathy:     Cervical: No cervical adenopathy.  Skin:    General:  Skin is warm and dry.  Neurological:     General: No focal deficit present.     Mental Status: He is alert and oriented to person, place, and time.  Psychiatric:        Mood and Affect: Mood normal.        Behavior: Behavior normal.      UC Treatments / Results  Labs (all labs ordered are listed, but only abnormal results are displayed) Labs Reviewed  POC COVID19/FLU A&B COMBO - Abnormal; Notable for the following components:      Result Value   Covid Antigen, POC Positive (*)    All other components within normal limits   Comprehensive metabolic panel Order: [id number]  Status: Final result     Next appt: None   Test Result Released: Yes (seen)   0 Result Notes        Component Ref Range & Units (hover) 10 mo ago 1 yr ago 2 yr ago 3 yr ago 5 yr ago  Sodium 136 135 139 137 132 Low   Potassium 4.1 3.8 3.3 Low  4.2 3.8  Chloride 102 102 101 104 99  CO2 26 23 29 25 25   Glucose, Bld 104 High  91 CM 86 CM 111 High  CM 153 High   Comment: Glucose reference range applies only to samples taken after fasting for at least 8 hours.  BUN 10 13 10 12 13   Creatinine, Ser 0.82 0.92 0.99 0.92 0.91  Calcium 9.2 9.2 9.3 8.9 9.1  Total Protein 7.3 7.9  7.3 7.7  Albumin 4.3 4.3  4.2 4.1  AST 21 24  19 17   ALT 26 38  17 17  Alkaline Phosphatase 54 74  50 56  Total Bilirubin 0.9 0.9 R  0.7 R 1.5 High  R  GFR, Estimated >60 >60 CM >60 CM >60 CM   Comment: (NOTE) Calculated using the CKD-EPI Creatinine Equation (2021)  Anion gap 8 10 CM 9 CM 8 CM 8 CM  Comment: Performed at [facility], [address] [place], [place] [address]  Resulting Agency [facility]        Specimen Collected:  10/19/23 12:46 Last Resulted: 10/19/23 13:17   EKG   Radiology No results found.  Procedures Procedures (including critical care time)  Medications Ordered in UC Medications - No data to display  Initial Impression / Assessment and  Plan / UC Course  I have reviewed the triage vital signs and the nursing notes.  Pertinent labs & imaging results that were available during my care of the patient were reviewed by me and considered in my medical decision making (see chart for details).     Reviewed exam and symptoms with patient.  Negative flu, positive COVID-19.  He states he has had COVID in the past without complication or hospitalization.  Due to his past medical history of rheumatoid arthritis and reported chronic kidney disease will do Paxlovid  twice daily for 5 days.  Labs were reviewed.  Medications were reviewed and he takes no medications currently.  Tessalon  as needed for cough.  Advise rest fluids and PCP follow-up if symptoms do not improve.  ER precautions reviewed Final Clinical Impressions(s) / UC Diagnoses   Final diagnoses:  COVID-19     Discharge Instructions      You have tested positive for COVID-19.  You may start Paxlovid  twice daily for 5 days.  This is an antiviral medication that will help reduce chance of complication and severity of symptoms.  It does not make the COVID infection go away.  May take Tessalon  3 times a day as needed for your cough.  Lots of rest and fluids.  Follow-up with your PCP if your symptoms do not improve.  Please go to the ER for any worsening symptoms.  Hope you feel better soon!     ED Prescriptions     Medication Sig Dispense Auth. Provider   nirmatrelvir/ritonavir (PAXLOVID , 300/100,) 20 x 150 MG & 10 x 100MG  TBPK Take 3 tablets by mouth 2 (two) times daily for 5 days. Take nirmatrelvir (150 mg) two tablets twice daily for 5 days and ritonavir (100 mg) one tablet twice daily for 5 days. 30 tablet Joanie Duprey, Jodi R, NP   benzonatate  (TESSALON ) 200 MG capsule Take 1 capsule (200 mg total) by mouth 3 (three) times daily as needed. 20 capsule Jinny Sweetland, Jodi R, NP      PDMP not reviewed this encounter.    Loreda Myla SAUNDERS, NP 08/20/24 1501     [1]  Social  History Tobacco Use   Smoking status: Never   Smokeless tobacco: Never  Vaping Use   Vaping status: Every Day  Substance Use Topics   Alcohol use: Yes    Comment: social   Drug use: Never     Loreda Myla SAUNDERS, NP 08/20/24 1502  "

## 2024-08-20 NOTE — ED Triage Notes (Signed)
 Pt c/o congestion, cough, headache onset yesterday. Pt states yesterday fever was 99.9 F and today 99.6 F. Pt has taken allergy medication today.

## 2024-08-20 NOTE — Discharge Instructions (Signed)
 You have tested positive for COVID-19.  You may start Paxlovid  twice daily for 5 days.  This is an antiviral medication that will help reduce chance of complication and severity of symptoms.  It does not make the COVID infection go away.  May take Tessalon  3 times a day as needed for your cough.  Lots of rest and fluids.  Follow-up with your PCP if your symptoms do not improve.  Please go to the ER for any worsening symptoms.  Hope you feel better soon!
